# Patient Record
Sex: Female | Born: 1960 | Race: White | Hispanic: No | Marital: Married | Smoking: Never smoker
Health system: Southern US, Community
[De-identification: ages and names within clinical notes are randomized; demographics above are authoritative.]

## PROBLEM LIST (undated history)

## (undated) DIAGNOSIS — J386 Stenosis of larynx: Secondary | ICD-10-CM

## (undated) DIAGNOSIS — J302 Other seasonal allergic rhinitis: Secondary | ICD-10-CM

## (undated) DIAGNOSIS — K219 Gastro-esophageal reflux disease without esophagitis: Secondary | ICD-10-CM

## (undated) DIAGNOSIS — R0600 Dyspnea, unspecified: Secondary | ICD-10-CM

## (undated) DIAGNOSIS — J45909 Unspecified asthma, uncomplicated: Secondary | ICD-10-CM

## (undated) DIAGNOSIS — N342 Other urethritis: Secondary | ICD-10-CM

## (undated) DIAGNOSIS — N6019 Diffuse cystic mastopathy of unspecified breast: Secondary | ICD-10-CM

## (undated) DIAGNOSIS — Z803 Family history of malignant neoplasm of breast: Secondary | ICD-10-CM

## (undated) DIAGNOSIS — B001 Herpesviral vesicular dermatitis: Secondary | ICD-10-CM

## (undated) HISTORY — DX: Family history of malignant neoplasm of breast: Z80.3

## (undated) HISTORY — DX: Diffuse cystic mastopathy of unspecified breast: N60.19

## (undated) HISTORY — DX: Unspecified asthma, uncomplicated: J45.909

## (undated) HISTORY — DX: Other urethritis: N34.2

---
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
## 1992-04-04 HISTORY — PX: TUBAL LIGATION: SHX77

## 2000-03-16 ENCOUNTER — Other Ambulatory Visit: Admission: RE | Admit: 2000-03-16 | Discharge: 2000-03-16 | Payer: Self-pay | Admitting: *Deleted

## 2001-10-23 ENCOUNTER — Other Ambulatory Visit: Admission: RE | Admit: 2001-10-23 | Discharge: 2001-10-23 | Payer: Self-pay | Admitting: Obstetrics and Gynecology

## 2004-09-15 ENCOUNTER — Other Ambulatory Visit: Admission: RE | Admit: 2004-09-15 | Discharge: 2004-09-15 | Payer: Self-pay | Admitting: Obstetrics and Gynecology

## 2004-11-08 ENCOUNTER — Encounter: Admission: RE | Admit: 2004-11-08 | Discharge: 2004-11-08 | Payer: Self-pay | Admitting: *Deleted

## 2006-04-07 ENCOUNTER — Other Ambulatory Visit: Admission: RE | Admit: 2006-04-07 | Discharge: 2006-04-07 | Payer: Self-pay | Admitting: Obstetrics & Gynecology

## 2007-07-24 ENCOUNTER — Other Ambulatory Visit: Admission: RE | Admit: 2007-07-24 | Discharge: 2007-07-24 | Payer: Self-pay | Admitting: Obstetrics & Gynecology

## 2008-05-30 ENCOUNTER — Encounter: Admission: RE | Admit: 2008-05-30 | Discharge: 2008-05-30 | Payer: Self-pay | Admitting: Obstetrics and Gynecology

## 2009-01-15 ENCOUNTER — Encounter: Admission: RE | Admit: 2009-01-15 | Discharge: 2009-01-15 | Payer: Self-pay | Admitting: Obstetrics & Gynecology

## 2010-10-20 ENCOUNTER — Other Ambulatory Visit: Payer: Self-pay | Admitting: Obstetrics and Gynecology

## 2010-10-20 DIAGNOSIS — Z1231 Encounter for screening mammogram for malignant neoplasm of breast: Secondary | ICD-10-CM

## 2010-11-04 ENCOUNTER — Ambulatory Visit: Payer: Self-pay

## 2010-11-09 ENCOUNTER — Ambulatory Visit
Admission: RE | Admit: 2010-11-09 | Discharge: 2010-11-09 | Disposition: A | Discharge status: Home / Self Care | Payer: 59 | Source: Ambulatory Visit | Attending: Obstetrics and Gynecology | Admitting: Obstetrics and Gynecology

## 2010-11-09 ENCOUNTER — Ambulatory Visit: Payer: Self-pay

## 2010-11-09 DIAGNOSIS — Z1231 Encounter for screening mammogram for malignant neoplasm of breast: Secondary | ICD-10-CM

## 2011-12-02 ENCOUNTER — Other Ambulatory Visit: Payer: Self-pay | Admitting: Obstetrics and Gynecology

## 2011-12-02 DIAGNOSIS — Z1231 Encounter for screening mammogram for malignant neoplasm of breast: Secondary | ICD-10-CM

## 2011-12-08 ENCOUNTER — Ambulatory Visit
Admission: RE | Admit: 2011-12-08 | Discharge: 2011-12-08 | Disposition: A | Discharge status: Home / Self Care | Payer: 59 | Source: Ambulatory Visit | Attending: Obstetrics and Gynecology | Admitting: Obstetrics and Gynecology

## 2011-12-08 DIAGNOSIS — Z1231 Encounter for screening mammogram for malignant neoplasm of breast: Secondary | ICD-10-CM

## 2011-12-08 LAB — HM PAP SMEAR: HM Pap smear: NEGATIVE

## 2011-12-12 ENCOUNTER — Other Ambulatory Visit: Payer: Self-pay | Admitting: Obstetrics and Gynecology

## 2011-12-12 DIAGNOSIS — R928 Other abnormal and inconclusive findings on diagnostic imaging of breast: Secondary | ICD-10-CM

## 2011-12-19 ENCOUNTER — Ambulatory Visit
Admission: RE | Admit: 2011-12-19 | Discharge: 2011-12-19 | Disposition: A | Discharge status: Home / Self Care | Payer: 59 | Source: Ambulatory Visit | Attending: Obstetrics and Gynecology | Admitting: Obstetrics and Gynecology

## 2011-12-19 DIAGNOSIS — R928 Other abnormal and inconclusive findings on diagnostic imaging of breast: Secondary | ICD-10-CM

## 2012-12-10 ENCOUNTER — Encounter: Payer: Self-pay | Admitting: Nurse Practitioner

## 2012-12-11 ENCOUNTER — Encounter: Payer: Self-pay | Admitting: Nurse Practitioner

## 2012-12-11 ENCOUNTER — Ambulatory Visit (INDEPENDENT_AMBULATORY_CARE_PROVIDER_SITE_OTHER): Payer: 59 | Admitting: Nurse Practitioner

## 2012-12-11 VITALS — BP 108/60 | HR 64 | Resp 16 | Ht 67.5 in | Wt 160.0 lb

## 2012-12-11 DIAGNOSIS — Z0000 Encounter for general adult medical examination without abnormal findings: Secondary | ICD-10-CM

## 2012-12-11 DIAGNOSIS — N912 Amenorrhea, unspecified: Secondary | ICD-10-CM

## 2012-12-11 DIAGNOSIS — Z01419 Encounter for gynecological examination (general) (routine) without abnormal findings: Secondary | ICD-10-CM

## 2012-12-11 LAB — COMPREHENSIVE METABOLIC PANEL
ALT: 17 U/L (ref 0–35)
Albumin: 4.8 g/dL (ref 3.5–5.2)
Alkaline Phosphatase: 38 U/L — ABNORMAL LOW (ref 39–117)
BUN: 16 mg/dL (ref 6–23)
Calcium: 10.3 mg/dL (ref 8.4–10.5)
Total Bilirubin: 0.8 mg/dL (ref 0.3–1.2)

## 2012-12-11 LAB — POCT URINALYSIS DIPSTICK
Nitrite, UA: NEGATIVE
Protein, UA: NEGATIVE
Urobilinogen, UA: NEGATIVE
pH, UA: 5

## 2012-12-11 LAB — LIPID PANEL
HDL: 51 mg/dL (ref >39)
Total CHOL/HDL Ratio: 3.1 Ratio
Triglycerides: 104 mg/dL (ref <150)

## 2012-12-11 LAB — TSH: TSH: 2.069 u[IU]/mL (ref 0.350–4.500)

## 2012-12-11 LAB — HEMOGLOBIN, FINGERSTICK: Hemoglobin, fingerstick: 14.2 g/dL (ref 12.0–16.0)

## 2012-12-11 MED ORDER — MEDROXYPROGESTERONE ACETATE 10 MG PO TABS: 10.0000 mg | ORAL_TABLET | Freq: Every day | ORAL | Status: DC

## 2012-12-11 NOTE — Progress Notes (Signed)
Patient ID: Angela Duarte, female   DOB: [birth year], 52 y.o.   MRN: [id number] 52 y.o. [id number] Married Caucasian Fe here for annual exam.  She feels well.  Having a lot more vaso symptoms especially at night. Does not ever want HRT. No menses since February. Last year irregular cycles and took Provera last May with regular cycles until this year.  No signs of PMS or menstrual symptoms.  Patient's last menstrual period was 05/05/2012.          Sexually active: yes  The current method of family planning is tubal ligation.    Exercising: no  The patient does not participate in regular exercise at present. Smoker:  no  Health Maintenance: Pap:  12/08/11, WNL, neg HR HPV MMG:  12/08/11, U/S 9/04/10/11 BI-Rads 2: benign findings Colonoscopy:  08/2012, repeat in 10 years BMD:   never TDaP:  Fall 2009 Labs: HB:   Urine: Negative, pH 5.0    reports that she has never smoked. She has never used smokeless tobacco. She reports that she does not drink alcohol or use illicit drugs.  Past Medical History  Diagnosis Date  . Urethritis     post coital  . Fibrocystic breast changes     Past Surgical History  Procedure Laterality Date  . Ureter surgery  1968    reconstruction  . Appendectomy  1976  . Tubal ligation Bilateral 1984    Current Outpatient Prescriptions  Medication Sig Dispense Refill  . cetirizine (ZYRTEC) 10 MG tablet Take 10 mg by mouth daily.       No current facility-administered medications for this visit.    Family History  Problem Relation Age of Onset  . Breast cancer Sister 3  . Breast cancer Maternal Aunt     post menopausal  . Breast cancer Maternal Grandmother     post menopausal    ROS:  Pertinent items are noted in HPI.  Otherwise, a comprehensive ROS was negative.  Exam:   BP 108/60  Pulse 64  Resp 16  Ht 5' 7.5" (1.715 m)  Wt 160 lb (72.576 kg)  BMI 24.68 kg/m2  LMP 05/05/2012 Height: 5' 7.5" (171.5 cm)  Ht Readings from Last 3 Encounters:  12/11/12  5' 7.5" (1.715 m)    General appearance: alert, cooperative and appears stated age Head: Normocephalic, without obvious abnormality, atraumatic Neck: no adenopathy, supple, symmetrical, trachea midline and thyroid normal to inspection and palpation Lungs: clear to auscultation bilaterally Breasts: normal appearance, no masses or tenderness, FCB changes bilaterally wothout mass. Heart: regular rate and rhythm Abdomen: soft, non-tender; no masses,  no organomegaly Extremities: extremities normal, atraumatic, no cyanosis or edema Skin: Skin color, texture, turgor normal. No rashes or lesions Lymph nodes: Cervical, supraclavicular, and axillary nodes normal. No abnormal inguinal nodes palpated Neurologic: Grossly normal   Pelvic: External genitalia:  no lesions              Urethra:  normal appearing urethra with no masses, tenderness or lesions              Bartholin's and Skene's: normal                 Vagina: normal appearing vagina with normal color and discharge, no lesions              Cervix: anteverted              Pap taken: no Bimanual Exam:  Uterus:  normal size, contour, position, consistency,  mobility, non-tender              Adnexa: no mass, fullness, tenderness               Rectovaginal: Confirms               Anus:  normal sphincter tone, no lesions  A:  Well Woman with normal exam  Peri menopausal consistent with irregular cycles  Amenorrhea since 05/05/2012  History of FCB  P:   Pap smear as per guidelines   Mammogram due 9/14  Provera challenge 10 mg for 10 days, then expect withdrawal bleed, if no bleeding to call back in 3-4 weeks.  Will get routine labs including an [facility] today and follow  Counseled on breast self exam, adequate intake of calcium and vitamin D, diet and exercise return annually or prn  An After Visit Summary was printed and given to the patient.

## 2012-12-11 NOTE — Patient Instructions (Addendum)

## 2012-12-12 LAB — FOLLICLE STIMULATING HORMONE: FSH: 118.5 m[IU]/mL — ABNORMAL HIGH

## 2012-12-13 NOTE — Progress Notes (Signed)
Encounter reviewed by Dr. Dajanee Voorheis Silva.  

## 2013-02-07 ENCOUNTER — Other Ambulatory Visit: Payer: Self-pay

## 2013-03-14 ENCOUNTER — Other Ambulatory Visit: Payer: Self-pay

## 2013-03-14 DIAGNOSIS — Z1231 Encounter for screening mammogram for malignant neoplasm of breast: Secondary | ICD-10-CM

## 2013-03-27 ENCOUNTER — Encounter: Payer: Self-pay | Admitting: Nurse Practitioner

## 2013-04-25 ENCOUNTER — Ambulatory Visit
Admission: RE | Admit: 2013-04-25 | Discharge: 2013-04-25 | Disposition: A | Discharge status: Home / Self Care | Payer: BC Managed Care – PPO | Source: Ambulatory Visit

## 2013-04-25 DIAGNOSIS — Z1231 Encounter for screening mammogram for malignant neoplasm of breast: Secondary | ICD-10-CM

## 2013-04-26 ENCOUNTER — Other Ambulatory Visit: Payer: Self-pay | Admitting: Obstetrics and Gynecology

## 2013-04-26 ENCOUNTER — Other Ambulatory Visit: Payer: Self-pay | Admitting: Cardiology

## 2013-04-26 DIAGNOSIS — R928 Other abnormal and inconclusive findings on diagnostic imaging of breast: Secondary | ICD-10-CM

## 2013-05-03 ENCOUNTER — Other Ambulatory Visit: Payer: Self-pay | Admitting: Specialist

## 2013-05-03 DIAGNOSIS — R109 Unspecified abdominal pain: Secondary | ICD-10-CM

## 2013-05-07 ENCOUNTER — Ambulatory Visit
Admission: RE | Admit: 2013-05-07 | Discharge: 2013-05-07 | Disposition: A | Discharge status: Home / Self Care | Payer: BC Managed Care – PPO | Source: Ambulatory Visit | Attending: Obstetrics and Gynecology | Admitting: Obstetrics and Gynecology

## 2013-05-07 DIAGNOSIS — R928 Other abnormal and inconclusive findings on diagnostic imaging of breast: Secondary | ICD-10-CM

## 2013-05-10 ENCOUNTER — Ambulatory Visit
Admission: RE | Admit: 2013-05-10 | Discharge: 2013-05-10 | Disposition: A | Discharge status: Home / Self Care | Payer: BC Managed Care – PPO | Source: Ambulatory Visit | Attending: Specialist | Admitting: Specialist

## 2013-05-10 ENCOUNTER — Other Ambulatory Visit: Payer: Self-pay | Admitting: Specialist

## 2013-05-10 DIAGNOSIS — R109 Unspecified abdominal pain: Secondary | ICD-10-CM

## 2013-05-10 MED ORDER — IOHEXOL 300 MG/ML  SOLN: 100.0000 mL | Freq: Once | INTRAMUSCULAR | Status: DC | PRN

## 2013-05-10 MED ORDER — IOHEXOL 300 MG/ML  SOLN
100.0000 mL | Freq: Once | INTRAMUSCULAR | Status: AC | PRN
  Administered 2013-05-10: 100 mL via INTRAVENOUS

## 2013-05-24 ENCOUNTER — Telehealth: Payer: Self-pay | Admitting: Nurse Practitioner

## 2013-05-24 ENCOUNTER — Other Ambulatory Visit: Payer: Self-pay | Admitting: Nurse Practitioner

## 2013-05-24 MED ORDER — SULFAMETHOXAZOLE-TMP DS 800-160 MG PO TABS: 1.0000 | ORAL_TABLET | Freq: Two times a day (BID) | ORAL | Status: DC

## 2013-05-24 MED ORDER — SULFAMETHOXAZOLE-TMP DS 800-160 MG PO TABS: ORAL_TABLET | ORAL | Status: DC

## 2013-05-24 NOTE — Telephone Encounter (Signed)
Patient calling to request sufamethoxazole RX. She says she normally gets this from [place] year.  [facility] [address] [address] [place]  [phone]

## 2013-05-24 NOTE — Telephone Encounter (Signed)
Last AEX 12/11/12 Next appt 12/23/2013  Last refill 06/06/2011 #30/3 refills. Paper chart in your door.

## 2013-05-24 NOTE — Telephone Encounter (Signed)
Rx approved by Ms. Patty LM for pt notifying we sent Rx to her pharmacy.

## 2013-12-12 ENCOUNTER — Ambulatory Visit: Payer: 59 | Admitting: Nurse Practitioner

## 2013-12-23 ENCOUNTER — Ambulatory Visit: Payer: 59 | Admitting: Nurse Practitioner

## 2014-01-28 ENCOUNTER — Encounter: Payer: Self-pay | Admitting: Nurse Practitioner

## 2014-01-28 ENCOUNTER — Ambulatory Visit (INDEPENDENT_AMBULATORY_CARE_PROVIDER_SITE_OTHER): Payer: BC Managed Care – PPO | Admitting: Nurse Practitioner

## 2014-01-28 ENCOUNTER — Other Ambulatory Visit: Payer: Self-pay | Admitting: Nurse Practitioner

## 2014-01-28 VITALS — BP 130/84 | HR 68 | Ht 67.75 in | Wt 160.0 lb

## 2014-01-28 DIAGNOSIS — Z0000 Encounter for general adult medical examination without abnormal findings: Secondary | ICD-10-CM

## 2014-01-28 DIAGNOSIS — Z01419 Encounter for gynecological examination (general) (routine) without abnormal findings: Secondary | ICD-10-CM

## 2014-01-28 DIAGNOSIS — R8290 Unspecified abnormal findings in urine: Secondary | ICD-10-CM

## 2014-01-28 LAB — LIPID PANEL
Cholesterol: 157 mg/dL (ref 0–200)
HDL: 55 mg/dL (ref >39)
LDL CALC: 85 mg/dL (ref 0–99)
TRIGLYCERIDES: 87 mg/dL (ref <150)
Total CHOL/HDL Ratio: 2.9 Ratio
VLDL: 17 mg/dL (ref 0–40)

## 2014-01-28 LAB — COMPREHENSIVE METABOLIC PANEL
ALK PHOS: 38 U/L — AB (ref 39–117)
ALT: 20 U/L (ref 0–35)
AST: 21 U/L (ref 0–37)
Albumin: 4.6 g/dL (ref 3.5–5.2)
BUN: 16 mg/dL (ref 6–23)
CALCIUM: 9.7 mg/dL (ref 8.4–10.5)
CHLORIDE: 105 meq/L (ref 96–112)
CO2: 28 mEq/L (ref 19–32)
Creat: 0.77 mg/dL (ref 0.50–1.10)
Glucose, Bld: 100 mg/dL — ABNORMAL HIGH (ref 70–99)
Potassium: 4.8 mEq/L (ref 3.5–5.3)
SODIUM: 139 meq/L (ref 135–145)
TOTAL PROTEIN: 7 g/dL (ref 6.0–8.3)
Total Bilirubin: 0.7 mg/dL (ref 0.2–1.2)

## 2014-01-28 LAB — POCT URINALYSIS DIPSTICK
BILIRUBIN UA: NEGATIVE
Glucose, UA: NEGATIVE
KETONES UA: NEGATIVE
NITRITE UA: NEGATIVE
PH UA: 5
Protein, UA: NEGATIVE
RBC UA: NEGATIVE
Urobilinogen, UA: NEGATIVE

## 2014-01-28 LAB — HEMOGLOBIN, FINGERSTICK: Hemoglobin, fingerstick: 13.9 g/dL (ref 12.0–16.0)

## 2014-01-28 LAB — TSH: TSH: 1.662 u[IU]/mL (ref 0.350–4.500)

## 2014-01-28 MED ORDER — SULFAMETHOXAZOLE-TMP DS 800-160 MG PO TABS: ORAL_TABLET | ORAL | Status: DC

## 2014-01-28 NOTE — Progress Notes (Addendum)
Patient ID: Angela DesanctisCynthia F Janeway, female   DOB: 12/30/1960, 53 y.o.   MRN: [id number] 53 y.o. [id number] Married Caucasian Fe here for annual exam.  This year had menses in January or February after using pregesterone cream for a month. This occurred about 30 -45 days later with a light cycle that lasted 3-4 days   No menses since then.  Some vaso symptoms mainly at night but they are tolerable.   Sleep is OK.  Rare use of Bactrim PC.  Patient's last menstrual period was 05/05/2013.          Sexually active: yes  The current method of family planning is tubal ligation.  Exercising: no The patient does not participate in regular exercise at present.  Smoker: no   Health Maintenance:  Pap: 12/08/11, WNL, neg HR HPV  MMG: 04/25/13,Diagnostic left 05/08/03, BI-Rads 2: benign findings, repeat in one year Colonoscopy: 08/2012, repeat in 10 years  TDaP: Fall 2009  Labs:  HB:  13.9  Urine:  Trace leuk's (no symptoms)   reports that she has never smoked. She has never used smokeless tobacco. She reports that she does not drink alcohol or use illicit drugs.  Past Medical History  Diagnosis Date  . Urethritis     post coital  . Fibrocystic breast changes     Past Surgical History  Procedure Laterality Date  . Ureter surgery  1968    reconstruction  . Appendectomy  1976  . Tubal ligation Bilateral 1994  . Wisdom tooth extraction  1981    Current Outpatient Prescriptions  Medication Sig Dispense Refill  . cetirizine (ZYRTEC) 10 MG tablet Take 10 mg by mouth daily.      Marland Kitchen. sulfamethoxazole-trimethoprim (BACTRIM DS) 800-160 MG per tablet 1 tab PC prn  30 tablet  1   No current facility-administered medications for this visit.    Family History  Problem Relation Age of Onset  . Breast cancer Sister [id number]    first occurence age 52, second at age 52  . Breast cancer Maternal Aunt     post menopausal  . Pancreatic cancer Mother [phone]  . Breast cancer Paternal Aunt     post menoausal    ROS:  Pertinent  items are noted in HPI.  Otherwise, a comprehensive ROS was negative.  Exam:   BP 130/84  Pulse 68  Ht 5' 7.75" (1.721 m)  Wt 160 lb (72.576 kg)  BMI 24.50 kg/m2  LMP 05/05/2013 Height: 5' 7.75" (172.1 cm)  Ht Readings from Last 3 Encounters:  01/28/14 5' 7.75" (1.721 m)  12/11/12 5' 7.5" (1.715 m)    General appearance: alert, cooperative and appears stated age Head: Normocephalic, without obvious abnormality, atraumatic Neck: no adenopathy, supple, symmetrical, trachea midline and thyroid normal to inspection and palpation Lungs: clear to auscultation bilaterally Breasts: normal appearance, no masses or tenderness Heart: regular rate and rhythm Abdomen: soft, non-tender; no masses,  no organomegaly Extremities: extremities normal, atraumatic, no cyanosis or edema Skin: Skin color, texture, turgor normal. No rashes or lesions Lymph nodes: Cervical, supraclavicular, and axillary nodes normal. No abnormal inguinal nodes palpated Neurologic: Grossly normal   Pelvic: External genitalia:  no lesions              Urethra:  normal appearing urethra with no masses, tenderness or lesions              Bartholin's and Skene's: normal  Vagina: normal appearing vagina with normal color and discharge, no lesions              Cervix: anteverted              Pap taken: No. Bimanual Exam:  Uterus:  normal size, contour, position, consistency, mobility, non-tender              Adnexa: no mass, fullness, tenderness               Rectovaginal: Confirms               Anus:  normal sphincter tone, no lesions  A:  Well Woman with normal exam  Peri menopausal consistent with irregular cycles   Amenorrhea since 05/2013  History of FCB  FMH of breast cancer  R/O UTI  P:   Reviewed health and wellness pertinent to exam  Pap smear not taken today  Mammogram is due 1/16  Will folw with labs and urine  Counseled on breast self exam, mammography screening, adequate intake of calcium  and vitamin D, diet and exercise, Kegel's exercises return annually or prn  An After Visit Summary was printed and given to the patient.

## 2014-01-28 NOTE — Patient Instructions (Signed)

## 2014-01-29 LAB — VITAMIN D 25 HYDROXY (VIT D DEFICIENCY, FRACTURES): Vit D, 25-Hydroxy: 41 ng/mL (ref 30–89)

## 2014-01-30 LAB — HEMOGLOBIN A1C
Hgb A1c MFr Bld: 5.3 % (ref <5.7)
Mean Plasma Glucose: 105 mg/dL (ref <117)

## 2014-01-30 LAB — URINE CULTURE
COLONY COUNT: NO GROWTH
Organism ID, Bacteria: NO GROWTH

## 2014-01-30 NOTE — Progress Notes (Signed)
Encounter reviewed by Dr. Jonae Renshaw Silva.  

## 2014-02-03 ENCOUNTER — Encounter: Payer: Self-pay | Admitting: Nurse Practitioner

## 2014-03-13 ENCOUNTER — Telehealth: Payer: Self-pay | Admitting: Nurse Practitioner

## 2014-03-13 NOTE — Telephone Encounter (Signed)
Pt had aex in October and was under the impression her urine was cultured and hasn't received results.  Please call pt  bf

## 2014-03-13 NOTE — Telephone Encounter (Signed)
Spoke with patient. Advised patient of results as seen below from Lauro FranklinPatricia Rolen-Grubb, FNP. Patient is agreeable and verbalizes understanding.  Notes Recorded by Lauro FranklinPatricia Rolen-Grubb, FNP on 01/30/2014 at 3:04 PM Results via my chart:  Aram Beechamynthia, The urine culture was negative. Lipid panel looks great continue with the healthy diet. Thyroid, Vit D, liver function, kidney function is al normal. The glucose was elevated so added a HGB AIC ( 3 month average of Blood sugar).and it was normal. See the next test result for that. Notes Recorded by Lauro FranklinPatricia Rolen-Grubb, FNP on 01/30/2014 at 8:10 AM Add HGB AIC  Routing to provider for final review. Patient agreeable to disposition. Will close encounter

## 2014-07-17 ENCOUNTER — Telehealth: Payer: Self-pay | Admitting: Nurse Practitioner

## 2014-07-17 NOTE — Telephone Encounter (Signed)
Left patient a message to call back to reschedule a future appointment that was cancelled by the provider. °

## 2014-12-31 ENCOUNTER — Other Ambulatory Visit: Payer: Self-pay

## 2014-12-31 DIAGNOSIS — Z1231 Encounter for screening mammogram for malignant neoplasm of breast: Secondary | ICD-10-CM

## 2015-01-28 ENCOUNTER — Ambulatory Visit: Payer: No Typology Code available for payment source

## 2015-02-04 ENCOUNTER — Encounter: Payer: Self-pay | Admitting: Nurse Practitioner

## 2015-02-04 ENCOUNTER — Ambulatory Visit (INDEPENDENT_AMBULATORY_CARE_PROVIDER_SITE_OTHER): Payer: PRIVATE HEALTH INSURANCE | Admitting: Nurse Practitioner

## 2015-02-04 VITALS — BP 110/82 | HR 68 | Ht 67.5 in | Wt 168.0 lb

## 2015-02-04 DIAGNOSIS — Z01419 Encounter for gynecological examination (general) (routine) without abnormal findings: Secondary | ICD-10-CM | POA: Diagnosis not present

## 2015-02-04 DIAGNOSIS — Z0000 Encounter for general adult medical examination without abnormal findings: Secondary | ICD-10-CM

## 2015-02-04 DIAGNOSIS — Z800 Family history of malignant neoplasm of digestive organs: Secondary | ICD-10-CM | POA: Diagnosis not present

## 2015-02-04 LAB — POCT URINALYSIS DIPSTICK
Bilirubin, UA: NEGATIVE
Blood, UA: NEGATIVE
GLUCOSE UA: NEGATIVE
Ketones, UA: NEGATIVE
Nitrite, UA: NEGATIVE
PROTEIN UA: NEGATIVE
Urobilinogen, UA: NEGATIVE
pH, UA: 6

## 2015-02-04 LAB — LIPID PANEL
Cholesterol: 180 mg/dL (ref 125–200)
HDL: 51 mg/dL (ref >=46)
LDL CALC: 109 mg/dL (ref <130)
TRIGLYCERIDES: 100 mg/dL (ref <150)
Total CHOL/HDL Ratio: 3.5 Ratio (ref <=5.0)
VLDL: 20 mg/dL (ref <30)

## 2015-02-04 LAB — TSH: TSH: 1.642 u[IU]/mL (ref 0.350–4.500)

## 2015-02-04 LAB — COMPREHENSIVE METABOLIC PANEL
ALBUMIN: 4.6 g/dL (ref 3.6–5.1)
ALT: 29 U/L (ref 6–29)
AST: 26 U/L (ref 10–35)
Alkaline Phosphatase: 56 U/L (ref 33–130)
BUN: 15 mg/dL (ref 7–25)
CHLORIDE: 104 mmol/L (ref 98–110)
CO2: 24 mmol/L (ref 20–31)
CREATININE: 0.72 mg/dL (ref 0.50–1.05)
Calcium: 9.5 mg/dL (ref 8.6–10.4)
GLUCOSE: 88 mg/dL (ref 65–99)
POTASSIUM: 4.5 mmol/L (ref 3.5–5.3)
SODIUM: 138 mmol/L (ref 135–146)
Total Bilirubin: 0.7 mg/dL (ref 0.2–1.2)
Total Protein: 7 g/dL (ref 6.1–8.1)

## 2015-02-04 LAB — HEMOGLOBIN, FINGERSTICK: Hemoglobin, fingerstick: 14.1 g/dL (ref 12.0–16.0)

## 2015-02-04 NOTE — Patient Instructions (Signed)

## 2015-02-04 NOTE — Progress Notes (Signed)
Patient ID: Angela Duarte, female   DOB: 11/25/1960, 54 y.o.   MRN: [id number] 54 y.o. [id number] Married  Caucasian Fe here for annual exam.  Amenorrhea since  05/2013.  Some vaso symptoms that are tolerable. Some vaginal dryness.  She had CT scan done 05/10/13 which was normal for screening of pancreatic cancer.  Her antigen test was negative in 2014 ?Marland Kitchen.  She desires a repeat antigen test.  Patient's last menstrual period was 05/05/2013 (approximate).          Sexually active: Yes.    The current method of family planning is tubal ligation and post menopausal status.    Exercising: No.  The patient does not participate in regular exercise at present. Smoker:  no  Health Maintenance: Pap: 12/08/11, WNL, neg HR HPV  MMG: 04/25/13,Diagnostic left 05/07/13, BI-Rads 2: benign findings, repeat scheduled for 02/24/15 Colonoscopy: 08/2012, normal, repeat in 10 years  TDaP: Fall 2009  Labs: HB: 14.1   Urine: Trace leuk's no symptoms   reports that she has never smoked. She has never used smokeless tobacco. She reports that she does not drink alcohol or use illicit drugs.  Past Medical History  Diagnosis Date  . Urethritis     post coital  . Fibrocystic breast changes     Past Surgical History  Procedure Laterality Date  . Ureter surgery  1968    reconstruction  . Appendectomy  1976  . Tubal ligation Bilateral 1994  . Wisdom tooth extraction  1981    Current Outpatient Prescriptions  Medication Sig Dispense Refill  . cetirizine (ZYRTEC) 10 MG tablet Take 10 mg by mouth daily.    Marland Kitchen. sulfamethoxazole-trimethoprim (BACTRIM DS) 800-160 MG per tablet 1 tab PC prn 30 tablet 1  . valACYclovir (VALTREX) 500 MG tablet as directed.  1   No current facility-administered medications for this visit.    Family History  Problem Relation Age of Onset  . Breast cancer Sister [id number]    first occurence age 53, second at age 53  . Breast cancer Maternal Aunt     post menopausal  . Pancreatic cancer Mother  [id number]  . Breast cancer Paternal Aunt     post menoausal    ROS:  Pertinent items are noted in HPI.  Otherwise, a comprehensive ROS was negative.  Exam:   BP 110/82 mmHg  Pulse 68  Ht 5' 7.5" (1.715 m)  Wt 168 lb (76.204 kg)  BMI 25.91 kg/m2  LMP 05/05/2013 (Approximate) Height: 5' 7.5" (171.5 cm) Ht Readings from Last 3 Encounters:  02/04/15 5' 7.5" (1.715 m)  01/28/14 5' 7.75" (1.721 m)  12/11/12 5' 7.5" (1.715 m)    General appearance: alert, cooperative and appears stated age Head: Normocephalic, without obvious abnormality, atraumatic Neck: no adenopathy, supple, symmetrical, trachea midline and thyroid normal to inspection and palpation Lungs: clear to auscultation bilaterally Breasts: normal appearance, no masses or tenderness Heart: regular rate and rhythm Abdomen: soft, non-tender; no masses,  no organomegaly Extremities: extremities normal, atraumatic, no cyanosis or edema Skin: Skin color, texture, turgor normal. No rashes or lesions Lymph nodes: Cervical, supraclavicular, and axillary nodes normal. No abnormal inguinal nodes palpated Neurologic: Grossly normal   Pelvic: External genitalia:  no lesions              Urethra:  normal appearing urethra with no masses, tenderness or lesions              Bartholin's and Skene's: normal  Vagina: normal appearing vagina with normal color and discharge, no lesions              Cervix: anteverted              Pap taken: Yes.   Bimanual Exam:  Uterus:  normal size, contour, position, consistency, mobility, non-tender              Adnexa: no mass, fullness, tenderness               Rectovaginal: Confirms               Anus:  normal sphincter tone, no lesions  Chaperone present: yes  A:  Well Woman with normal exam  Peri menopausal consistent with irregular cycles  Amenorrhea since 05/2013 History of FCB [facility] of breast cancer and pancreatic cancer   P:    Reviewed health and wellness pertinent to exam  Pap smear as above  Mammogram is due and is scheduled for 02/24/15  Will get screening test for pancreatic cancer antigen, and follow with other labs.  She does not need a refill on Bactrim or Valtrex for oral HSV at this time - will call back.  Counseled on breast self exam, mammography screening, adequate intake of calcium and vitamin D, diet and exercise, Kegel's exercises return annually or prn  An After Visit Summary was printed and given to the patient.

## 2015-02-05 ENCOUNTER — Ambulatory Visit: Payer: BC Managed Care – PPO | Admitting: Nurse Practitioner

## 2015-02-05 LAB — HIV ANTIBODY (ROUTINE TESTING W REFLEX): HIV: NONREACTIVE

## 2015-02-05 LAB — VITAMIN D 25 HYDROXY (VIT D DEFICIENCY, FRACTURES): Vit D, 25-Hydroxy: 34 ng/mL (ref 30–100)

## 2015-02-05 LAB — CANCER ANTIGEN 19-9: CA 19-9: 26.8 U/mL (ref <35.0)

## 2015-02-05 LAB — HEPATITIS C ANTIBODY: HCV Ab: NEGATIVE

## 2015-02-05 NOTE — Progress Notes (Signed)
Encounter reviewed by Dr. Krystle Oberman Amundson C. Silva.  

## 2015-02-09 LAB — IPS PAP TEST WITH HPV

## 2015-02-24 ENCOUNTER — Ambulatory Visit
Admission: RE | Admit: 2015-02-24 | Discharge: 2015-02-24 | Disposition: A | Discharge status: Home / Self Care | Payer: 59 | Source: Ambulatory Visit

## 2015-02-24 DIAGNOSIS — Z1231 Encounter for screening mammogram for malignant neoplasm of breast: Secondary | ICD-10-CM

## 2015-11-23 DIAGNOSIS — J301 Allergic rhinitis due to pollen: Secondary | ICD-10-CM | POA: Insufficient documentation

## 2015-11-23 DIAGNOSIS — M47814 Spondylosis without myelopathy or radiculopathy, thoracic region: Secondary | ICD-10-CM | POA: Insufficient documentation

## 2015-11-23 DIAGNOSIS — K602 Anal fissure, unspecified: Secondary | ICD-10-CM | POA: Insufficient documentation

## 2015-12-03 DIAGNOSIS — R350 Frequency of micturition: Secondary | ICD-10-CM | POA: Insufficient documentation

## 2015-12-03 DIAGNOSIS — R922 Inconclusive mammogram: Secondary | ICD-10-CM | POA: Insufficient documentation

## 2015-12-03 DIAGNOSIS — Z9189 Other specified personal risk factors, not elsewhere classified: Secondary | ICD-10-CM | POA: Insufficient documentation

## 2015-12-03 DIAGNOSIS — Z800 Family history of malignant neoplasm of digestive organs: Secondary | ICD-10-CM | POA: Insufficient documentation

## 2015-12-22 ENCOUNTER — Other Ambulatory Visit: Payer: Self-pay | Admitting: Internal Medicine

## 2015-12-22 ENCOUNTER — Other Ambulatory Visit: Payer: Self-pay | Admitting: Specialist

## 2015-12-22 DIAGNOSIS — R1030 Lower abdominal pain, unspecified: Secondary | ICD-10-CM

## 2015-12-25 ENCOUNTER — Ambulatory Visit
Admission: RE | Admit: 2015-12-25 | Discharge: 2015-12-25 | Disposition: A | Discharge status: Home / Self Care | Payer: No Typology Code available for payment source | Source: Ambulatory Visit | Attending: Specialist | Admitting: Specialist

## 2015-12-25 ENCOUNTER — Other Ambulatory Visit: Payer: Self-pay | Admitting: Specialist

## 2015-12-25 DIAGNOSIS — R1030 Lower abdominal pain, unspecified: Secondary | ICD-10-CM

## 2015-12-28 ENCOUNTER — Other Ambulatory Visit: Payer: 59

## 2016-02-16 ENCOUNTER — Other Ambulatory Visit: Payer: Self-pay | Admitting: Nurse Practitioner

## 2016-02-16 DIAGNOSIS — Z1231 Encounter for screening mammogram for malignant neoplasm of breast: Secondary | ICD-10-CM

## 2016-03-01 ENCOUNTER — Encounter: Payer: Self-pay | Admitting: Nurse Practitioner

## 2016-03-01 ENCOUNTER — Other Ambulatory Visit: Payer: Self-pay | Admitting: Nurse Practitioner

## 2016-03-01 ENCOUNTER — Ambulatory Visit (INDEPENDENT_AMBULATORY_CARE_PROVIDER_SITE_OTHER): Payer: PRIVATE HEALTH INSURANCE | Admitting: Nurse Practitioner

## 2016-03-01 VITALS — BP 116/74 | HR 72 | Ht 67.25 in | Wt 162.0 lb

## 2016-03-01 DIAGNOSIS — Z01419 Encounter for gynecological examination (general) (routine) without abnormal findings: Secondary | ICD-10-CM | POA: Diagnosis not present

## 2016-03-01 DIAGNOSIS — Z800 Family history of malignant neoplasm of digestive organs: Secondary | ICD-10-CM | POA: Diagnosis not present

## 2016-03-01 DIAGNOSIS — Z0000 Encounter for general adult medical examination without abnormal findings: Secondary | ICD-10-CM

## 2016-03-01 DIAGNOSIS — N760 Acute vaginitis: Secondary | ICD-10-CM

## 2016-03-01 LAB — CBC
HEMATOCRIT: 44.1 % (ref 35.0–45.0)
Hemoglobin: 15.2 g/dL (ref 11.7–15.5)
MCH: 32.8 pg (ref 27.0–33.0)
MCHC: 34.5 g/dL (ref 32.0–36.0)
MCV: 95 fL (ref 80.0–100.0)
MPV: 9.4 fL (ref 7.5–12.5)
Platelets: 197 10*3/uL (ref 140–400)
RBC: 4.64 MIL/uL (ref 3.80–5.10)
RDW: 12.9 % (ref 11.0–15.0)
WBC: 4.4 10*3/uL (ref 3.8–10.8)

## 2016-03-01 LAB — COMPREHENSIVE METABOLIC PANEL
ALBUMIN: 4.5 g/dL (ref 3.6–5.1)
ALT: 21 U/L (ref 6–29)
AST: 21 U/L (ref 10–35)
Alkaline Phosphatase: 53 U/L (ref 33–130)
BUN: 12 mg/dL (ref 7–25)
CALCIUM: 9.6 mg/dL (ref 8.6–10.4)
CHLORIDE: 106 mmol/L (ref 98–110)
CO2: 23 mmol/L (ref 20–31)
CREATININE: 0.86 mg/dL (ref 0.50–1.05)
Glucose, Bld: 90 mg/dL (ref 65–99)
Potassium: 4.3 mmol/L (ref 3.5–5.3)
SODIUM: 141 mmol/L (ref 135–146)
Total Bilirubin: 0.8 mg/dL (ref 0.2–1.2)
Total Protein: 6.8 g/dL (ref 6.1–8.1)

## 2016-03-01 LAB — LIPID PANEL
Cholesterol: 176 mg/dL (ref <200)
HDL: 65 mg/dL (ref >50)
LDL CALC: 98 mg/dL (ref <100)
TRIGLYCERIDES: 64 mg/dL (ref <150)
Total CHOL/HDL Ratio: 2.7 Ratio (ref <5.0)
VLDL: 13 mg/dL (ref <30)

## 2016-03-01 LAB — POCT URINALYSIS DIPSTICK
BILIRUBIN UA: NEGATIVE
GLUCOSE UA: NEGATIVE
KETONES UA: NEGATIVE
Leukocytes, UA: NEGATIVE
NITRITE UA: NEGATIVE
Protein, UA: NEGATIVE
RBC UA: NEGATIVE
Urobilinogen, UA: NEGATIVE
pH, UA: 5

## 2016-03-01 LAB — TSH: TSH: 1.64 mIU/L

## 2016-03-01 LAB — HEMOGLOBIN, FINGERSTICK: Hemoglobin, fingerstick: 14.7 g/dL (ref 12.0–16.0)

## 2016-03-01 NOTE — Progress Notes (Signed)
Patient ID: Angela DesanctisCynthia F Duarte, female   DOB: 09/24/1960, 55 y.o.   MRN: [id number]  55 y.o. [id number] Married  Caucasian Fe here for annual exam.  [place].  Some vaso symptoms that are tolerable. She had been taking Vit K2 for 2 weeks.  She then stopped and had right OV pain like ovulation and spotted with wiping only.  Then PUS was done on 12/25/15 with endo lining at 8 mm.  She was told no other follow up was needed.  Last FSH was 118.5 on 12/11/2012.    Sister had genetic testing done for pancreatic cancer and her test was positive.    Patient's last menstrual period was 05/05/2013 (approximate).          Sexually active: Yes.    The current method of family planning is tubal ligation and post menopausal status.    Exercising: No.  The patient does not participate in regular exercise at present. Smoker:  no  Health Maintenance: Pap: 02/04/15, Negative with neg HR HPV  MMG: 02/24/15, 3D, Bi-Rads 2: Benign Findings, scheduled for 03/22/16 Colonoscopy: 08/2012, normal, repeat in 10 years BMD: Never TDaP: 12/04/07 Hep C and HIV: 02/04/15 Labs: HB: 14.7   Urine: Negative   reports that she has never smoked. She has never used smokeless tobacco. She reports that she does not drink alcohol or use drugs.  Past Medical History:  Diagnosis Date  . Fibrocystic breast changes   . Urethritis    post coital    Past Surgical History:  Procedure Laterality Date  . APPENDECTOMY  1976  . TUBAL LIGATION Bilateral 1994  . URETER SURGERY  1968   reconstruction  . WISDOM TOOTH EXTRACTION  1981    Current Outpatient Prescriptions  Medication Sig Dispense Refill  . cetirizine (ZYRTEC) 10 MG tablet Take 10 mg by mouth daily.    Marland Kitchen. sulfamethoxazole-trimethoprim (BACTRIM DS) 800-160 MG per tablet 1 tab PC prn 30 tablet 1  . valACYclovir (VALTREX) 500 MG tablet as directed.  1   No current facility-administered medications for this visit.     Family History  Problem Relation Age  of Onset  . Breast cancer Sister [id number]    first occurence age 346, second at age 54  . Breast cancer Maternal Aunt     post menopausal  . Pancreatic cancer Mother 7678  . Breast cancer Paternal Aunt     post menoausal    ROS:  Pertinent items are noted in HPI.  Otherwise, a comprehensive ROS was negative.  Exam:   LMP 05/05/2013 (Approximate)    Ht Readings from Last 3 Encounters:  02/04/15 5' 7.5" (1.715 m)  01/28/14 5' 7.75" (1.721 m)  12/11/12 5' 7.5" (1.715 m)    General appearance: alert, cooperative and appears stated age Head: Normocephalic, without obvious abnormality, atraumatic Neck: no adenopathy, supple, symmetrical, trachea midline and thyroid normal to inspection and palpation Lungs: clear to auscultation bilaterally Breasts: normal appearance, no masses or tenderness Heart: regular rate and rhythm Abdomen: soft, non-tender; no masses,  no organomegaly Extremities: extremities normal, atraumatic, no cyanosis or edema Skin: Skin color, texture, turgor normal. No rashes or lesions Lymph nodes: Cervical, supraclavicular, and axillary nodes normal. No abnormal inguinal nodes palpated Neurologic: Grossly normal   Pelvic: External genitalia:  no lesions              Urethra:  normal appearing urethra with no masses, tenderness or lesions  Bartholin's and Skene's: normal                 Vagina: normal appearing vagina with normal color and white discharge, no lesions, no bleeding              Cervix: anteverted              Pap taken: No. Bimanual Exam:  Uterus:  normal size, contour, position, consistency, mobility, non-tender              Adnexa: no mass, fullness, tenderness               Rectovaginal: Confirms               Anus:  normal sphincter tone, no lesions  Chaperone present: yes  A:  Well Woman with normal exam:  Menopausal  Recent AUB with PUS on 12/25/15 with 8 mm endo lining Amenorrhea since 05/2013 with FSH 118.5 on  12/12/12 History of FCB FMH of breast cancer and pancreatic cancer R/O Vaginitis   P:   Reviewed health and wellness pertinent to exam  Pap smear as above  Mammogram is due and scheduled for 03/22/16  Will follow with labs  Will refer her to genetic counselor  Will consult with Dr, Mayme GentaMiller  Counseled on breast self exam, mammography screening, adequate intake of calcium and vitamin D, diet and exercise, Kegel's exercises return annually or prn  An After Visit Summary was printed and given to the patient.

## 2016-03-01 NOTE — Patient Instructions (Signed)

## 2016-03-02 ENCOUNTER — Other Ambulatory Visit: Payer: Self-pay | Admitting: Nurse Practitioner

## 2016-03-02 LAB — VITAMIN D 25 HYDROXY (VIT D DEFICIENCY, FRACTURES): Vit D, 25-Hydroxy: 41 ng/mL (ref 30–100)

## 2016-03-02 LAB — WET PREP BY MOLECULAR PROBE
Candida species: NEGATIVE
Gardnerella vaginalis: POSITIVE — AB
Trichomonas vaginosis: NEGATIVE

## 2016-03-02 LAB — CANCER ANTIGEN 19-9: CA 19-9: 24 U/mL (ref <34)

## 2016-03-02 LAB — FOLLICLE STIMULATING HORMONE: FSH: 67.3 m[IU]/mL

## 2016-03-02 MED ORDER — METRONIDAZOLE 0.75 % VA GEL: 1.0000 | Freq: Every day | VAGINAL | 0 refills | Status: DC

## 2016-03-03 NOTE — Progress Notes (Signed)
Pt needs endometrial biopsy due to 8mm endometrium noted on PUS.  PMP x 2 years.  Reviewed personally.  Lum KeasM. Suzanne Calee Nugent, MD.

## 2016-03-07 ENCOUNTER — Other Ambulatory Visit: Payer: Self-pay | Admitting: Nurse Practitioner

## 2016-03-07 ENCOUNTER — Telehealth: Payer: Self-pay | Admitting: Nurse Practitioner

## 2016-03-07 DIAGNOSIS — N950 Postmenopausal bleeding: Secondary | ICD-10-CM

## 2016-03-07 NOTE — Telephone Encounter (Signed)
Agree with pt calling them.  She is also the pt who needs endo biopsy per Dr. Hyacinth MeekerMiller.

## 2016-03-07 NOTE — Telephone Encounter (Signed)
Left message that I would like for her to CB and discuss test results and recommendations per Dr. Hyacinth MeekerMiller.  The information to give her is :  [facility] at 67.3.  Because of bleeding even though minimal and even though her PUS shows only an 8 mm lining - she still needs endo biopsy per Dr. Hyacinth MeekerMiller.  Needs to schedule.  Pt has called back and given the information.  She is willing to schedule and order is placed.  She has questions about referral to genetics and has not heard back from them.

## 2016-03-07 NOTE — Telephone Encounter (Signed)
Returned call to patient regarding referral for genetic testing.  Patient stated a "kelly" with the cancer center was to [place]. I provided the patient with [facility]. Patient is agreeable to contacting their office to schedule recommended appointment.   Routing to Walt DisneyPatricia Grubb

## 2016-03-07 NOTE — Progress Notes (Signed)
Please schedule pt for this after authorization.

## 2016-03-11 NOTE — Telephone Encounter (Signed)
Patient started her cycle and would like to talk with a nurse about this before her upcoming biopsy.

## 2016-03-11 NOTE — Telephone Encounter (Signed)
Call patient to discuss benefits for a recommended procedure. Left Voicemail requesting a return call. Advised in message to ask for [place] or [place]

## 2016-03-11 NOTE — Progress Notes (Signed)
Please see telephone encounter dated 03/07/2016.

## 2016-03-11 NOTE — Telephone Encounter (Signed)
Spoke with patient. Patient states that 2 days ago she started having light bleeding again. "It is not normal bleeding. It is light, but I have seen some small clots too." Is wearing a mini pad that she is changing once per day. Having mild cramping. Denies any heavy bleeding. Advised patient of importance of scheduling EMB. Patient states she is waiting to hear regarding her benefits before scheduling. Declines to schedule before having benefit information. Advised I will speak with benefits department and will have someone return her call so that we may proceed with scheduling. Patient is agreeable.  Routing to [employer]zy Dixon for [employer]

## 2016-03-11 NOTE — Telephone Encounter (Signed)
Patient is asking for benefit information for her upcoming biopsy.

## 2016-03-11 NOTE — Telephone Encounter (Signed)
Spoke with pt regarding benefit for endometrial biopsy. Patient understood and agreeable.  Patient scheduled 04/12/16 with Dr Hyacinth MeekerMiller. Earlier appointment dates were offered, but patient declined stating "I'm swamped between now and first of the year taking care of a relative in a nursing home and being out of town".  Patient is aware of date, arrival time and cancellation. Patient had no further questions.   Routing to Dr Hyacinth MeekerMiller for final review

## 2016-03-22 ENCOUNTER — Ambulatory Visit
Admission: RE | Admit: 2016-03-22 | Discharge: 2016-03-22 | Disposition: A | Discharge status: Home / Self Care | Payer: No Typology Code available for payment source | Source: Ambulatory Visit | Attending: Nurse Practitioner | Admitting: Nurse Practitioner

## 2016-03-22 DIAGNOSIS — Z1231 Encounter for screening mammogram for malignant neoplasm of breast: Secondary | ICD-10-CM

## 2016-04-12 ENCOUNTER — Ambulatory Visit: Payer: PRIVATE HEALTH INSURANCE | Admitting: Obstetrics & Gynecology

## 2016-04-19 ENCOUNTER — Telehealth: Payer: Self-pay | Admitting: *Deleted

## 2016-04-19 NOTE — Telephone Encounter (Signed)
Follow-up call to patient regarding canceled endometrial biopsy appointment with Dr Hyacinth MeekerMiller from 04-12-16. (according to appt desk patient canceled due to death in the family). Per ROI, can leave message on cell number and voice mail confirms "Aram BeechamCynthia."  Left message of sympathy regarding death in family. Checking to see if she is ready to reschedule important follow-up appointment for assessment of bleeding. Left message to call back to Kennon RoundsSally and I will work with her schedule to get her in to see Dr Hyacinth MeekerMiller.

## 2016-05-03 NOTE — Telephone Encounter (Signed)
Follow-up call to patient. Per ROI, can leave detailed message on cell number. Left message calling to follow up on cancelled appointment and we will be happy to assist her in rescheduling. We understand she has had extenuating circumstances so we will work with her to accommodate her schedule needs. Just want to be sure she knows importance of the importance and getting it rescheduled at her earliest convenience. Left message to call back and ask for [facility].

## 2016-05-10 NOTE — Telephone Encounter (Signed)
Follow-up call to patient regarding canceled endometrial biopsy. Voice mail confirms "Aram BeechamCynthia."  Left message to call back regarding rescheduling of important appointment.

## 2016-06-12 ENCOUNTER — Encounter: Payer: Self-pay | Admitting: *Deleted

## 2016-06-16 NOTE — Telephone Encounter (Signed)
Reviewed with Dr Hyacinth MeekerMiller. Letter with Dr Rondel BatonMiller's  recommendations mailed certified and regular [place] mail.  Encounter closed.

## 2016-09-23 ENCOUNTER — Other Ambulatory Visit: Payer: Self-pay | Admitting: Obstetrics & Gynecology

## 2016-09-23 ENCOUNTER — Other Ambulatory Visit: Payer: Self-pay | Admitting: Nurse Practitioner

## 2016-09-23 MED ORDER — TRIMETHOPRIM 100 MG PO TABS: ORAL_TABLET | ORAL | 0 refills | Status: DC

## 2016-09-23 NOTE — Telephone Encounter (Signed)
Medication refill request: Bactrim Last AEX:  03-01-16  Next AEX: 03-08-17  Last MMG (if hormonal medication request): 03-22-16 WNL  Refill authorized: please advise   Sending to SM since PG and DL are out of the office

## 2016-09-23 NOTE — Telephone Encounter (Signed)
Patient calling for a refill on sulfamethox[facility] in [place]. Pharmacy number is [phone]

## 2016-09-23 NOTE — Telephone Encounter (Signed)
Please let pt know that rx was sent into pharamcy.  She's been getting sulfamethoxazole/trimethoprim.  For post-coital prophylaxis for UTI, trimethoprim should be enough so rx changed to this.    Also, can you please see if she's had any other bleeding.  She had episode of PMP bleeding and biopsy was recommended but she declined to have this done.  I'm wondering if this has occurred again.  Thanks.

## 2016-09-26 NOTE — Telephone Encounter (Signed)
Spoke with patient and informed her that RX was sent in. Patient states she has not had anymore bleeding since her last visit.

## 2017-02-20 ENCOUNTER — Other Ambulatory Visit: Payer: Self-pay

## 2017-02-20 ENCOUNTER — Other Ambulatory Visit: Payer: Self-pay | Admitting: Nurse Practitioner

## 2017-02-20 DIAGNOSIS — Z1231 Encounter for screening mammogram for malignant neoplasm of breast: Secondary | ICD-10-CM

## 2017-03-08 ENCOUNTER — Ambulatory Visit: Payer: PRIVATE HEALTH INSURANCE | Admitting: Nurse Practitioner

## 2017-03-16 ENCOUNTER — Encounter: Payer: Self-pay | Admitting: Certified Nurse Midwife

## 2017-03-16 ENCOUNTER — Other Ambulatory Visit (HOSPITAL_COMMUNITY)
Admission: RE | Admit: 2017-03-16 | Discharge: 2017-03-16 | Disposition: A | Discharge status: Home / Self Care | Payer: No Typology Code available for payment source | Source: Ambulatory Visit | Attending: Obstetrics & Gynecology | Admitting: Obstetrics & Gynecology

## 2017-03-16 ENCOUNTER — Other Ambulatory Visit: Payer: Self-pay

## 2017-03-16 ENCOUNTER — Ambulatory Visit (INDEPENDENT_AMBULATORY_CARE_PROVIDER_SITE_OTHER): Payer: BLUE CROSS/BLUE SHIELD | Admitting: Certified Nurse Midwife

## 2017-03-16 VITALS — BP 110/82 | HR 78 | Ht 67.5 in | Wt 164.0 lb

## 2017-03-16 DIAGNOSIS — E559 Vitamin D deficiency, unspecified: Secondary | ICD-10-CM

## 2017-03-16 DIAGNOSIS — Z01419 Encounter for gynecological examination (general) (routine) without abnormal findings: Secondary | ICD-10-CM

## 2017-03-16 DIAGNOSIS — N951 Menopausal and female climacteric states: Secondary | ICD-10-CM

## 2017-03-16 DIAGNOSIS — N841 Polyp of cervix uteri: Secondary | ICD-10-CM | POA: Diagnosis not present

## 2017-03-16 DIAGNOSIS — Z124 Encounter for screening for malignant neoplasm of cervix: Secondary | ICD-10-CM

## 2017-03-16 DIAGNOSIS — Z0000 Encounter for general adult medical examination without abnormal findings: Secondary | ICD-10-CM

## 2017-03-16 DIAGNOSIS — Z8481 Family history of carrier of genetic disease: Secondary | ICD-10-CM

## 2017-03-16 NOTE — Patient Instructions (Signed)

## 2017-03-16 NOTE — Progress Notes (Signed)
56 y.o. [id number] Married  Caucasian Fe here for annual exam. Menopausal, no HRT. Occasional hot flashes, no issues. Denies vaginal bleeding Uses  vaginal dryness. Sees Dr. Shary Decamp PCP for , medication management of asthma, all stable. History of post coital UTI, no issues in a long time., needs update on Rx. Patient was having leg edema, but changed chairs and this has improved. Ran/walked in 5 K with some increase in asthma. Occasional urinary stress incontinence with running only. Has family history of colon cancer 19-9 antigen and tests yearly requests screening labs today. No other health issues today.  Patient's last menstrual period was 05/05/2013 (approximate).          Sexually active: Yes.    The current method of family planning is tubal ligation.    Exercising: Yes.    walking, running Smoker:  no  Health Maintenance: Pap:  02-04-15 neg HPV HR neg History of Abnormal Pap: no MMG:  03-22-16 category c density birads 1:neg, has scheduled 04/18/17 Self Breast exams: occasionally  Colonoscopy:  5/14 neg f/u 21yrs BMD:   none TDaP:  2013 Shingles: never  Pneumonia: never  Hep C and HIV: both neg 2016 Labs: discuss with provider    reports that  has never smoked. she has never used smokeless tobacco. She reports that she does not drink alcohol or use drugs.  Past Medical History:  Diagnosis Date  . Fibrocystic breast changes   . Urethritis    post coital    Past Surgical History:  Procedure Laterality Date  . APPENDECTOMY  1976  . TUBAL LIGATION Bilateral 1994  . URETER SURGERY  1968   reconstruction  . WISDOM TOOTH EXTRACTION  1981    Current Outpatient Medications  Medication Sig Dispense Refill  . albuterol (PROVENTIL HFA;VENTOLIN HFA) 108 (90 Base) MCG/ACT inhaler Inhale into the lungs as needed.     Marland Kitchen azelastine (ASTELIN) 0.1 % nasal spray Place into the nose as needed.     . cetirizine (ZYRTEC) 10 MG tablet Take 10 mg by mouth daily.    . metroNIDAZOLE (METROGEL)  0.75 % vaginal gel Place 1 Applicatorful vaginally at bedtime. (Patient taking differently: Place 1 Applicatorful vaginally as needed. ) 70 g 0  . trimethoprim (TRIMPEX) 100 MG tablet 1 tab po post coitally to prevent infection 30 tablet 0  . valACYclovir (VALTREX) 500 MG tablet as directed.  1   No current facility-administered medications for this visit.     Family History  Problem Relation Age of Onset  . Breast cancer Sister 110       first occurence age 83, second at age 61  . Pancreatic cancer Mother 17  . Breast cancer Maternal Aunt        post menopausal  . Breast cancer Paternal Aunt        post menoausal    ROS:  Pertinent items are noted in HPI.  Otherwise, a comprehensive ROS was negative.  Exam:   BP 110/82 (BP Location: Right Arm, Patient Position: Sitting, Cuff Size: Normal)   Pulse 78   Ht 5' 7.5" (1.715 m)   Wt 164 lb (74.4 kg)   LMP 05/05/2013 (Approximate)   HC 16" (40.6 cm)   BMI 25.31 kg/m  Height: 5' 7.5" (171.5 cm) Ht Readings from Last 3 Encounters:  03/16/17 5' 7.5" (1.715 m)  03/01/16 5' 7.25" (1.708 m)  02/04/15 5' 7.5" (1.715 m)    General appearance: alert, cooperative and appears stated age Head: Normocephalic, without  obvious abnormality, atraumatic Neck: no adenopathy, supple, symmetrical, trachea midline and thyroid normal to inspection and palpation Lungs: clear to auscultation bilaterally Breasts: normal appearance, no masses or tenderness, No nipple retraction or dimpling, No nipple discharge or bleeding, No axillary or supraclavicular adenopathy Heart: regular rate and rhythm Abdomen: soft, non-tender; no masses,  no organomegaly Extremities: extremities normal, atraumatic, no cyanosis or edema Skin: Skin color, texture, turgor normal. No rashes or lesions Lymph nodes: Cervical, supraclavicular, and axillary nodes normal. No abnormal inguinal nodes palpated Neurologic: Grossly normal   Pelvic: External genitalia:  no lesions               Urethra:  normal appearing urethra with no masses, tenderness or lesions              Bartholin's and Skene's: normal                 Vagina: normal appearing vagina with normal color and discharge, no lesions              Cervix: no cervical motion tenderness and cervical polyp very small noted in cervix, not friable              Pap taken: Yes.   Bimanual Exam:  Uterus:  normal size, contour, position, consistency, mobility, non-tender              Adnexa: normal adnexa and no mass, fullness, tenderness               Rectovaginal: Confirms               Anus:  normal sphincter tone, no lesions  Chaperone present: yes  A:  Well Woman with normal exam  Menopausal no HRT  Cervical polyp  History of Herpes oral with frequent occurrence needs up date on RX  History of Post coital UTI, Trimethoprim working well needs Rx  Update   Asthma management with PCP, needs appointment  Family history of colon cancer antigen 19-9  Screening labs  P:   Reviewed health and wellness pertinent to exam  Aware of need to evaluate if vaginal bleeding  Discussed benign finding of cervical polyp and etiology and warning signs and need to advise if occurs. Questions addressed.  Rx Valtrex see order with instructions  Rx Trimethoprim see order with instructions  Stressed importance of follow up with PCP  Labs: Colon cancer 19-9, Lipid panel, CMP, TSH, Vitamin D  Pap smear: yes   counseled on breast self exam, mammography screening, feminine hygiene, menopause, adequate intake of calcium and vitamin D, diet and exercise  return annually or prn  An After Visit Summary was printed and given to the patient.

## 2017-03-17 LAB — CANCER ANTIGEN 19-9: CAN 19-9: 27 U/mL (ref 0–35)

## 2017-03-17 LAB — COMPREHENSIVE METABOLIC PANEL
ALT: 22 IU/L (ref 0–32)
AST: 27 IU/L (ref 0–40)
Albumin/Globulin Ratio: 2.1 (ref 1.2–2.2)
Albumin: 4.9 g/dL (ref 3.5–5.5)
Alkaline Phosphatase: 55 IU/L (ref 39–117)
BUN/Creatinine Ratio: 24 — ABNORMAL HIGH (ref 9–23)
BUN: 19 mg/dL (ref 6–24)
Bilirubin Total: 0.7 mg/dL (ref 0.0–1.2)
CALCIUM: 10 mg/dL (ref 8.7–10.2)
CO2: 25 mmol/L (ref 20–29)
Chloride: 101 mmol/L (ref 96–106)
Creatinine, Ser: 0.78 mg/dL (ref 0.57–1.00)
GFR, EST AFRICAN AMERICAN: 99 mL/min/{1.73_m2} (ref >59)
GFR, EST NON AFRICAN AMERICAN: 86 mL/min/{1.73_m2} (ref >59)
GLUCOSE: 90 mg/dL (ref 65–99)
Globulin, Total: 2.3 g/dL (ref 1.5–4.5)
Potassium: 4.7 mmol/L (ref 3.5–5.2)
Sodium: 142 mmol/L (ref 134–144)
TOTAL PROTEIN: 7.2 g/dL (ref 6.0–8.5)

## 2017-03-17 LAB — CYTOLOGY - PAP: Diagnosis: NEGATIVE

## 2017-03-17 LAB — LIPID PANEL
Chol/HDL Ratio: 2.9 ratio (ref 0.0–4.4)
Cholesterol, Total: 168 mg/dL (ref 100–199)
HDL: 57 mg/dL
LDL Calculated: 97 mg/dL (ref 0–99)
Triglycerides: 69 mg/dL (ref 0–149)
VLDL Cholesterol Cal: 14 mg/dL (ref 5–40)

## 2017-03-17 LAB — VITAMIN D 25 HYDROXY (VIT D DEFICIENCY, FRACTURES): Vit D, 25-Hydroxy: 33.2 ng/mL (ref 30.0–100.0)

## 2017-03-17 LAB — TSH: TSH: 1.97 u[IU]/mL (ref 0.450–4.500)

## 2017-04-18 ENCOUNTER — Ambulatory Visit
Admission: RE | Admit: 2017-04-18 | Discharge: 2017-04-18 | Disposition: A | Discharge status: Home / Self Care | Payer: No Typology Code available for payment source | Source: Ambulatory Visit | Attending: Nurse Practitioner | Admitting: Nurse Practitioner

## 2017-04-18 DIAGNOSIS — Z1231 Encounter for screening mammogram for malignant neoplasm of breast: Secondary | ICD-10-CM

## 2017-12-13 ENCOUNTER — Telehealth: Payer: Self-pay | Admitting: Genetic Counselor

## 2017-12-13 ENCOUNTER — Encounter: Payer: Self-pay | Admitting: Genetic Counselor

## 2017-12-13 ENCOUNTER — Other Ambulatory Visit: Payer: BLUE CROSS/BLUE SHIELD

## 2017-12-13 NOTE — Telephone Encounter (Signed)
Pt cld wanting to schedule a genetic counseling appt. Pt has been scheduled to see Fuller Plan on 10/9 at 2pm. Pt aware to arrive 30 minutes early to be checked in on time. Letter mailed.

## 2018-01-10 ENCOUNTER — Other Ambulatory Visit: Payer: Self-pay

## 2018-01-10 ENCOUNTER — Encounter: Payer: BLUE CROSS/BLUE SHIELD | Admitting: Genetic Counselor

## 2018-01-10 ENCOUNTER — Inpatient Hospital Stay: Payer: BLUE CROSS/BLUE SHIELD

## 2018-01-10 ENCOUNTER — Inpatient Hospital Stay: Payer: BLUE CROSS/BLUE SHIELD | Attending: Genetic Counselor | Admitting: Genetic Counselor

## 2018-01-10 DIAGNOSIS — Z803 Family history of malignant neoplasm of breast: Secondary | ICD-10-CM

## 2018-01-10 DIAGNOSIS — Z800 Family history of malignant neoplasm of digestive organs: Secondary | ICD-10-CM | POA: Diagnosis not present

## 2018-01-11 ENCOUNTER — Encounter: Payer: Self-pay | Admitting: Genetic Counselor

## 2018-01-11 DIAGNOSIS — Z803 Family history of malignant neoplasm of breast: Secondary | ICD-10-CM | POA: Insufficient documentation

## 2018-01-11 NOTE — Progress Notes (Signed)
REFERRING PROVIDER: Raina Mina., MD [place], [place] [address]  PRIMARY PROVIDER:  Raina Mina., MD  PRIMARY REASON FOR VISIT:  1. Family history of pancreatic cancer   2. Family history of breast cancer      HISTORY OF PRESENT ILLNESS:   Angela Duarte, a 57 y.o. female, was seen for a [place] cancer genetics consultation at the request of Dr. Bea Graff due to a family history of cancer.  Angela Duarte presents to clinic today to discuss the possibility of a hereditary predisposition to cancer, genetic testing, and to further clarify her future cancer risks, as well as potential cancer risks for family members.   Angela Duarte is a 57 y.o. female with no personal history of cancer.  Her sister underwent genetic testing and was found to have a PALB2 mutation.  Angela Duarte was referred to learn about her risk for having a PALB2 mutation and undergo genetic testing.  CANCER HISTORY:   No history exists.     HORMONAL RISK FACTORS:  Menarche was at age 62.  First live birth at age 22.  OCP use for approximately 7-10 years.  Ovaries intact: yes.  Hysterectomy: no.  Menopausal status: postmenopausal.  HRT use: 0 years. Colonoscopy: yes; normal. Mammogram within the last year: yes. Number of breast biopsies: 0. Up to date with pelvic exams:  yes. Any excessive radiation exposure in the past:  no  Past Medical History:  Diagnosis Date  . Family history of breast cancer   . Fibrocystic breast changes   . Urethritis    post coital    Past Surgical History:  Procedure Laterality Date  . APPENDECTOMY  1976  . TUBAL LIGATION Bilateral 1994  . URETER SURGERY  1968   reconstruction  . [place] EXTRACTION  1981    Social History   Socioeconomic History  . Marital status: Married    Spouse name: Caryl Pina  . Number of children: 2  . Years of education: Not on file  . Highest education level: Not on file  Occupational History    Employer: [employer]  Social Needs  . Financial resource strain: Not on file  . Food insecurity:    Worry: Not on file    Inability: Not on file  . Transportation needs:    Medical: Not on file    Non-medical: Not on file  Tobacco Use  . Smoking status: Never Smoker  . Smokeless tobacco: Never Used  Substance and Sexual Activity  . Alcohol use: No  . Drug use: No  . Sexual activity: Yes    Partners: Male    Birth control/protection: Surgical    Comment: BTL  Lifestyle  . Physical activity:    Days per week: Not on file    Minutes per session: Not on file  . Stress: Not on file  Relationships  . Social connections:    Talks on phone: Not on file    Gets together: Not on file    Attends religious service: Not on file    Active member of club or organization: Not on file    Attends meetings of clubs or organizations: Not on file    Relationship status: Not on file  Other Topics Concern  . Not on file  Social History Narrative  . Not on file     FAMILY HISTORY:  We obtained a detailed, 4-generation family history.  Significant diagnoses are listed below: Family History  Problem Relation  Age of Onset  . Breast cancer Sister 62       first occurence age 45, second at age 58  . Pancreatic cancer Mother 39  . Breast cancer Maternal Aunt        post menopausal  . Breast cancer Paternal Aunt        post menoausal  . Breast cancer Maternal Aunt        dx over 49  . Lung cancer Maternal Aunt   . Bladder Cancer Maternal Aunt   . Breast cancer Cousin        mat first cousin  . Breast cancer Cousin        2 additional mat first cousin  . Lung cancer Paternal Uncle   . Breast cancer Paternal Aunt        dx over 52  . Breast cancer Cousin        pat cousin with possible breast cancer    The patient has two sons who are cancer free.  She has a brother and sister.  Her sister had breast cancer at 31 and 36 and was found to have a PALB2 mutation.  This sister had a daughter who had  testing and is also PALB2 pos.  Both parents are deceased.  The patient's mother had pancreatic cancer at age 40.  She had 13 siblings.  Two sisters had breast cancer and a sister had lung and kidney/bladder cancer.  One of the sisters with breast cancer has two daughters with breast cancer, and one of unaffected siblings has a daughter with breast cancer.  Both maternal grandparents are deceased from non cancer related issues.  The patient's father died by suicide.  He had seven siblings.  Two sisters had breast cancer and one of their daughters also had breast cancer.  One brother had lung cancer. Both paternal grandparents are deceased from non cancer related issues.   Angela Duarte is aware of previous family history of genetic testing for hereditary cancer risks. Patient's maternal ancestors are of Caucasian NOS descent, and paternal ancestors are of Caucasian NOS descent. There is no reported Ashkenazi Jewish ancestry. There is no known consanguinity.  GENETIC COUNSELING ASSESSMENT: Angela Duarte is a 57 y.o. 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 We, therefore, discussed and recommended the following at today's visit.   DISCUSSION: We discussed that about 5-10% of breast cancer is hereditary.  Her sister was found to have a hereditary form of breast cancer due to a PALB2 c.758dupT.  We discussed that PALB2 is a moderate risk gene, increasing the risk for breast cancer up to 55-60% lifetime risk for breast cancer, as well as increasing the risk for both pancreatic and prostate cancer.  PALB2 is inherited in a dominant fashion and therefore, the patient is at a 50% risk for also having this mutation.  She has a Bilineal risk for breast cancer based.  Therefore we discussed having a larger panel to see if there are other gene mutations running in the  family.  We reviewed the characteristics, features and inheritance patterns of hereditary cancer syndromes. We also discussed genetic testing, including the appropriate family members to test, the process of testing, insurance coverage and turn-around-time for results. We discussed the implications of a negative, positive and/or variant of uncertain significant result. In order to get genetic test results in a timely  manner so that Angela Duarte can use these genetic test results for surgical decisions, we recommended Angela Duarte pursue genetic testing for the 9-gene STAT panel. If this test is negative, we then recommend Angela Duarte pursue reflex genetic testing to the multi-cancer gene panel. The Multi-Gene Panel offered by [facility] includes sequencing and/or deletion duplication testing of the following 84 genes: AIP, ALK, APC, ATM, AXIN2,BAP1,  BARD1, BLM, BMPR1A, BRCA1, BRCA2, BRIP1, CASR, CDC73, CDH1, CDK4, CDKN1B, CDKN1C, CDKN2A (p14ARF), CDKN2A (p16INK4a), CEBPA, CHEK2, CTNNA1, DICER1, DIS3L2, EGFR (c.2369C>T, p.Thr790Met variant only), EPCAM (Deletion/duplication testing only), FH, FLCN, GATA2, GPC3, GREM1 (Promoter region deletion/duplication testing only), HOXB13 (c.251G>A, p.Gly84Glu), HRAS, KIT, MAX, MEN1, MET, MITF (c.952G>A, p.Glu318Lys variant only), MLH1, MSH2, MSH3, MSH6, MUTYH, NBN, NF1, NF2, NTHL1, PALB2, PDGFRA, PHOX2B, PMS2, POLD1, POLE, POT1, PRKAR1A, PTCH1, PTEN, RAD50, RAD51C, RAD51D, RB1, RECQL4, RET, RUNX1, SDHAF2, SDHA (sequence changes only), SDHB, SDHC, SDHD, SMAD4, SMARCA4, SMARCB1, SMARCE1, STK11, SUFU, TERC, TERT, TMEM127, TP53, TSC1, TSC2, VHL, WRN and WT1.    Based on Angela Duarte's personal and family history of cancer, she meets medical criteria for genetic testing. Despite that she meets criteria, she may still have an out of pocket cost. We discussed that if her out of pocket cost for testing is over $100, the laboratory will call and confirm whether she wants to proceed  with testing.  If the out of pocket cost of testing is less than $100 she will be billed by the genetic testing laboratory.   PLAN: After considering the risks, benefits, and limitations, Angela Duarte  provided informed consent to pursue genetic testing and the blood sample was sent to [facility] for analysis of the Multi cancer gene panel. Results should be available within approximately 2-3 weeks' time, at which point they will be disclosed by telephone to Angela Duarte, as will any additional recommendations warranted by these results. Angela Duarte will receive a summary of her genetic counseling visit and a copy of her results once available. This information will also be available in Epic. We encouraged Angela Duarte to remain in contact with cancer genetics annually so that we can continuously update the family history and inform her of any changes in cancer genetics and testing that may be of benefit for her family. Angela Duarte questions were answered to her satisfaction today. Our contact information was provided should additional questions or concerns arise.  Lastly, we encouraged Angela Duarte to remain in contact with cancer genetics annually so that we can continuously update the family history and inform her of any changes in cancer genetics and testing that may be of benefit for this family.   Ms.  Duarte questions were answered to her satisfaction today. Our contact information was provided should additional questions or concerns arise. Thank you for the referral and allowing [place] to share in the care of your patient.   Karen P. Florene Glen, [place], [facility] Certified Genetic Counselor [email or web address] phone: [phone]  The patient was seen for a total of 45 minutes in face-to-face genetic counseling.  This patient was discussed with Drs. Magrinat, Lindi Adie and/or Burr Medico who agrees with the above.    _______________________________________________________________________ For  Office Staff:  Number of people involved in session: 1 Was an Intern/ student involved with case: no

## 2018-01-18 ENCOUNTER — Encounter: Payer: Self-pay | Admitting: Genetic Counselor

## 2018-01-18 ENCOUNTER — Telehealth: Payer: Self-pay | Admitting: Genetic Counselor

## 2018-01-18 DIAGNOSIS — Z1379 Encounter for other screening for genetic and chromosomal anomalies: Secondary | ICD-10-CM | POA: Insufficient documentation

## 2018-01-18 NOTE — Telephone Encounter (Signed)
LM on VM that results are back and to please CB. 

## 2018-01-22 NOTE — Progress Notes (Signed)
Thank you for seeing patient. Should she be counseled regarding MRI breast screening with this increase risk. Lovett Sox, CNM

## 2018-01-22 NOTE — Progress Notes (Signed)
That would be fine. Thank you Debbi

## 2018-01-26 ENCOUNTER — Inpatient Hospital Stay (HOSPITAL_BASED_OUTPATIENT_CLINIC_OR_DEPARTMENT_OTHER): Payer: BLUE CROSS/BLUE SHIELD | Admitting: Genetic Counselor

## 2018-01-26 DIAGNOSIS — Z1379 Encounter for other screening for genetic and chromosomal anomalies: Secondary | ICD-10-CM | POA: Diagnosis not present

## 2018-01-26 NOTE — Progress Notes (Addendum)
GENETIC TEST RESULTS   Patient Name: Angela Duarte Patient Age: 57 y.o. Encounter Date: 01/26/2018  Referring Provider: Gilford Rile, MD  Christena Flake    Angela Duarte was seen in the [place] clinic on January 26, 2018 due to a family history of cancer and concern regarding a hereditary predisposition to cancer in the family.  She was seen with her sister, Shelton Silvas, who has also tested positive for the PALB2 familial mutation. Please refer to the prior Genetics clinic note for more information regarding Angela Duarte's medical and family histories and our assessment at the time.   FAMILY HISTORY:  We obtained a detailed, 4-generation family history.  Significant diagnoses are listed below: Family History  Problem Relation Age of Onset  . Breast cancer Sister 55       first occurence age 104, second at age 44  . Pancreatic cancer Mother 69  . Breast cancer Maternal Aunt        post menopausal  . Breast cancer Paternal Aunt        post menoausal  . Breast cancer Maternal Aunt        dx over 84  . Lung cancer Maternal Aunt   . Bladder Cancer Maternal Aunt   . Breast cancer Cousin        mat first cousin  . Breast cancer Cousin        2 additional mat first cousin  . Lung cancer Paternal Uncle   . Breast cancer Paternal Aunt        dx over 3  . Breast cancer Cousin        pat cousin with possible breast cancer    The patient has two sons who are cancer free.  She has a brother and sister.  Her sister had breast cancer at 54 and 22 and was found to have a PALB2 mutation.  This sister had a daughter who had testing and is also PALB2 pos.  Both parents are deceased.  The patient's mother had pancreatic cancer at age 38.  She had 13 siblings.  Two sisters had breast cancer and a sister had lung and kidney/bladder cancer.  One of the sisters with breast cancer has two daughters with breast cancer, and one of unaffected siblings has a daughter with breast cancer.  Both  maternal grandparents are deceased from non cancer related issues.  The patient's father died by suicide.  He had seven siblings.  Two sisters had breast cancer and one of their daughters also had breast cancer.  One brother had lung cancer. Both paternal grandparents are deceased from non cancer related issues.   Angela Duarte is aware of previous family history of genetic testing for hereditary cancer risks. Patient's maternal ancestors are of Caucasian NOS descent, and paternal ancestors are of Caucasian NOS descent. There is no reported Ashkenazi Jewish ancestry. There is no known consanguinity.  GENETIC TESTING:  At the time of Angela Duarte's visit, we recommended she pursue genetic testing for the Multicancer panel to identify the PALB2 mutation that was identified in the family. 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 There were no deleterious mutations in  AIP, ALK, APC, ATM, AXIN2,BAP1,  BARD1, BLM, BMPR1A, BRCA1, BRCA2, BRIP1, CASR, CDC73, CDH1, CDK4, CDKN1B, CDKN1C, CDKN2A (p14ARF), CDKN2A (p16INK4a), CEBPA, CHEK2, CTNNA1, DICER1, DIS3L2, EGFR (c.2369C>T, p.Thr790Met variant only), EPCAM (Deletion/duplication testing  only), FH, FLCN, GATA2, GPC3, GREM1 (Promoter region deletion/duplication testing only), HOXB13 (c.251G>A, p.Gly84Glu), HRAS, KIT, MAX, MEN1, MET, MITF (c.952G>A, p.Glu318Lys variant only), MLH1, MSH2, MSH3, MSH6, MUTYH, NBN, NF1, NF2, NTHL1, PDGFRA, PHOX2B, PMS2, POLD1, POLE, POT1, PRKAR1A, PTCH1, PTEN, RAD50, RAD51C, RAD51D, RB1, RECQL4, RET, RUNX1, SDHAF2, SDHA (sequence changes only), SDHB, SDHC, SDHD, SMAD4, SMARCA4, SMARCB1, SMARCE1, STK11, SUFU, TERC, TERT, TMEM127, TP53, TSC1, TSC2, VHL, WRN and WT1.    Genetic testing did detect two Variants of Unknown Significance - one in the GATA2 gene called c.1347C>A  (Silent) and the other in the RECQL4 gene, called c.2058+5G>A (Intronic). The latter is a variant in the intronic region. At this time, it is unknown if these variants are associated with increased cancer risk or if they are a normal finding, but most variants such as these get reclassified to being inconsequential. They should not be used to make medical management decisions. With time, we suspect the lab will determine the significance of these variants, if any. If we do learn more about them, we will try to contact Angela Duarte to discuss it further. However, it is important to stay in touch with [place] periodically and keep the address and phone number up to date.  Clinical condition The risk of breast cancer in women with a single pathogenic PALB2 variant is 33-58% by age 16, with higher risks among those with a greater number of relatives with breast cancer (PMID: 33007622, 63335456, 25638937, 34287681). One study found the risk of developing contralateral breast cancer is approximately 10% within five years after the initial diagnosis of breast cancer among individuals with a pathogenic variant in [place] (PMID: 15726203).  For both men and women, there is also an increased risk for pancreatic cancer, however, specific risk figures are not yet established (PMID: 55974163, 84536468, 03212248). Additional data suggests an increased risk of ovarian cancer (PMID: 25003704, 88891694) and female breast cancer (PMID: 50388828, 00349179, 15056979), although this evidence is limited and emerging.  Gene information PALB2 is a tumor-suppressor gene, meaning its function is to help control the rate of growth and cell division in the body. The protein product plays a critical role in homologous recombination repair (HRR) through its ability to recruit BRCA2 and RAD51 to DNA breaks (Uniprot: PALB2_HUMAN,Q86YC2 ReportMortgages.tn. Accessed January 2017). If there is a pathogenic variant in this gene that  prevents it from functioning normally, the risk of developing certain types of cancers may be increased.  Inheritance Hereditary predisposition to cancer due to pathogenic variants in the PALB2 gene has autosomal dominant inheritance. This means that an individual with a pathogenic variant has a 50% chance of passing the condition on to their offspring. Once a pathogenic mutation is detected in an individual, it is possible to identify at-risk relatives who can pursue testing for this specific familial variant. Many cases are inherited from a parent, but some cases may occur spontaneously (i.e., an individual with a pathogenic variant who has parents who do not have it).  Individuals with a single pathogenic PALB2 variant are also carriers of autosomal recessive Fanconi anemia type N. Fanconi anemia is characterized by bone marrow failure with variable additional anomalies, which often include short stature, abnormal skin pigmentation, abnormal thumbs, malformations of the skeletal and central nervous systems, and developmental delay (PMID: 4801655, 37482707). Risk of leukemia and early onset solid tumors is significantly elevated with this disorder (PMID: 86754492, 01007121, 97588325). For there to be a risk of Fanconi anemia in offspring, both the patient and their partner  would each have to carry a pathogenic variant in PALB2; in this case, the risk to have an affected child is 25%.  MEDICAL MANAGEMENT: The [place] (NCCN) has published screening and surveillance guidelines for women with a single pathogenic variant in [place] (NCCN. Genetic/Familial High-Risk Assessment: Breast and Ovarian. Version 1.2018):  - Annual mammography with consideration of tomosynthesis beginning at age 36 . Consider annual breast MRI with contrast starting at age 26, with modification as appropriate based on family history . Prophylactic risk-reducing mastectomy: consider based on family  history (evidence insufficient; manage based on family history)  - NCCN cites insufficient evidence to warrant screening for ovarian and pancreatic cancer (NCCN. Genetic/Familial High-Risk Assessment: Breast and Ovarian. Version 3.2019). In contrast, the [employer] of Gastroenterology Clinical Guidelines recommend pancreatic cancer screening in PALB2 carriers be limited to those with a first- or second-degree relative affected with pancreatic cancer. Ideally, screening should be performed in experienced centers utilizing a multidisciplinary approach under research conditions. Recommended screening includes annual endoscopic ultrasound and/or MRI of the pancreas starting at age 50 or 44 years younger than the earliest age of pancreatic cancer diagnosis in the family (PMID: 34196222).  Overall cancer risk assessment incorporates additional factors including personal medical history, family history, and any available genetic information that may result in a personalized plan for cancer prevention and surveillance.  Angela Duarte is eligible to be followed in the high risk breast clinic.  Based on her family history of pancreatic cancer, she is interested in considering high risk pancreatic cancer screening.  Therefore, we will refer her to Dr. Burr Medico, who participated not only in the high risk breast clinic, but also is the lead for the pancreatic cancer screening program.  FAMILY MEMBERS: It is important that all of Angela Duarte's relatives (both men and women) know of the presence of this gene mutation. Site-specific genetic testing can sort out who in the family is at risk and who is not. Knowing if a PALB2 pathogenic variant is present is advantageous. At-risk relatives can be identified, enabling pursuit of a diagnostic evaluation. Further, the available information regarding hereditary cancer susceptibility genes is constantly evolving and more clinically relevant data regarding PALB2 are likely to  become available in the near future. Awareness of this cancer predisposition encourages patients and their providers to inform at-risk family members, to diligently follow recommended screening protocols, and to be vigilant in maintaining close and regular contact with their local genetics clinic in anticipation of new information.  Angela Duarte children and siblings have a 50% chance to have inherited this mutation. We recommend they have genetic testing for this same mutation, as identifying the presence of this mutation would allow them to also take advantage of risk-reducing measures.   Based on Angela Duarte' testing, the laboratory [facility] will test any family member's sample for free for up to 90 days after her report date of January 24, 2018.  This includes not only her first degree relatives, but also more extended relatives like cousins   SUPPORT AND RESOURCES: If Angela Duarte is interested in PALB2-information and support, there are two groups, Facing Our Risk (www.facingourrisk.com) and Bright Pink (www.brightpink.org) which some people have found useful. They provide opportunities to speak with other individuals from high-risk families. To locate genetic counselors in other cities, visit the website of the [employer] (ArtistMovie.se) and Secretary/administrator for a Social worker by zip code.  We encouraged Angela Duarte to remain in contact with [place] on an  annual basis so we can update her personal and family histories, and let her know of advances in cancer genetics that may benefit the family. Our contact number was provided. Angela Duarte questions were answered to her satisfaction today, and she knows she is welcome to call anytime with additional questions.   Django Nguyen P. [place], [place], [facility] Certified Genetic Counselor [email or web address] phone: [phone]

## 2018-02-22 ENCOUNTER — Telehealth: Payer: Self-pay | Admitting: Hematology

## 2018-02-22 ENCOUNTER — Encounter: Payer: Self-pay | Admitting: Hematology

## 2018-02-22 NOTE — Telephone Encounter (Signed)
Pt has been scheduled to see Dr. Mosetta PuttFeng for high risk clinic on 05/02/18 at 230pm. Pt agreed to the appt date and time. Letter mailed.

## 2018-04-25 ENCOUNTER — Telehealth: Payer: Self-pay | Admitting: Hematology

## 2018-04-25 NOTE — Telephone Encounter (Signed)
Pt cld to reschedule appt to 3/10 at 230pm w/Dr. Mosetta Putt

## 2018-04-26 ENCOUNTER — Ambulatory Visit: Payer: BLUE CROSS/BLUE SHIELD | Admitting: Allergy and Immunology

## 2018-04-26 ENCOUNTER — Ambulatory Visit: Payer: BLUE CROSS/BLUE SHIELD | Admitting: Certified Nurse Midwife

## 2018-04-30 ENCOUNTER — Encounter: Payer: Self-pay | Admitting: Allergy and Immunology

## 2018-04-30 ENCOUNTER — Ambulatory Visit (INDEPENDENT_AMBULATORY_CARE_PROVIDER_SITE_OTHER): Payer: BLUE CROSS/BLUE SHIELD | Admitting: Allergy and Immunology

## 2018-04-30 VITALS — BP 112/80 | HR 76 | Temp 99.0°F | Resp 16 | Ht 66.5 in | Wt 153.6 lb

## 2018-04-30 DIAGNOSIS — K219 Gastro-esophageal reflux disease without esophagitis: Secondary | ICD-10-CM

## 2018-04-30 DIAGNOSIS — J4530 Mild persistent asthma, uncomplicated: Secondary | ICD-10-CM

## 2018-04-30 DIAGNOSIS — J383 Other diseases of vocal cords: Secondary | ICD-10-CM | POA: Diagnosis not present

## 2018-04-30 DIAGNOSIS — J3089 Other allergic rhinitis: Secondary | ICD-10-CM | POA: Diagnosis not present

## 2018-04-30 MED ORDER — FAMOTIDINE 40 MG PO TABS: 40.0000 mg | ORAL_TABLET | Freq: Every day | ORAL | 5 refills | Status: DC

## 2018-04-30 MED ORDER — OMEPRAZOLE 40 MG PO CPDR: 40.0000 mg | DELAYED_RELEASE_CAPSULE | ORAL | 5 refills | Status: DC

## 2018-04-30 NOTE — Progress Notes (Signed)
Angela Duarte,  Thank you for referring Angela Duarte to the [facility] [place] on 04/30/2018.   Below is a summation of this patient's evaluation and recommendations.  Thank you for your referral. I will keep you informed about this patient's response to treatment.   If you have any questions please do not hesitate to contact me.   Sincerely,  Angela Priest, MD Allergy / Immunology Angela Duarte   ______________________________________________________________________    NEW PATIENT NOTE  Referring Provider: Rhea Duarte* Primary Provider: Gordan Payment., MD Date of office visit: 04/30/2018    Subjective:   Chief Complaint:  Angela Duarte (DOB: 01-24-1961) is a 58 y.o. female who presents to the clinic on 04/30/2018 with a chief complaint of Asthma .     HPI: Angela Duarte presents to this clinic in evaluation of issues with breathing.  Apparently I had seen her in this clinic over 10 years ago for allergic rhinitis which apparently is a minimal issue at this point in time.  For several decade she has had issues with some sneezing and nasal congestion which has actually improved as she has aged and at this point time she rarely uses an antihistamine for these type of issues.  She has no associated anosmia or headache.  What has been a problem since about 2012 is the development of shortness of breath and coughing and wheezing.  Apparently this has been a progressive issue and this winter has been particularly bad.  Usually she has problems from February through April.  She describes shortness of breath and having difficulty breathing air in.  This gets much worse when talking.  She will get an occasional raspy voice and she feels as though there is a coating in her throat and she throat clears.  Interestingly, she can sleep throughout the night without much difficulty.   She can walk on a treadmill without difficulty.  She was given Qvar which she only uses as needed at this point and she rarely uses any pro-air that he she has been given in the past.  There does not really appear to be any obvious provoking factor giving rise to this issue except possibly outdoor exposure.  She did disconnect her gas logs about 4 weeks ago and this may have helped her issue somewhat.  She denies any symptoms consistent with reflux.  She does not have any problem swallowing or choking when drinking or eating.   Past Medical History:  Diagnosis Date  . Asthma   . Family history of breast cancer   . Fibrocystic breast changes   . Urethritis    post coital    Past Surgical History:  Procedure Laterality Date  . APPENDECTOMY  1976  . TUBAL LIGATION Bilateral 1994  . URETER SURGERY  1968   reconstruction  . WISDOM TOOTH EXTRACTION  1981    Allergies as of 04/30/2018   No Known Allergies     Medication List      cetirizine 10 MG tablet Commonly known as:  ZYRTEC Take 10 mg by mouth daily.   PROAIR HFA 108 (90 Base) MCG/ACT inhaler Generic drug:  albuterol Inhale 2 puffs into the lungs every 4 (four) hours as needed for wheezing or shortness of breath.   QVAR REDIHALER 80 MCG/ACT inhaler Generic drug:  beclomethasone       Review of systems negative except as noted in  HPI / PMHx or noted below:  Review of Systems  Constitutional: Negative.   HENT: Negative.   Eyes: Negative.   Respiratory: Negative.   Cardiovascular: Negative.   Gastrointestinal: Negative.   Genitourinary: Negative.   Musculoskeletal: Negative.   Skin: Negative.   Neurological: Negative.   Endo/Heme/Allergies: Negative.   Psychiatric/Behavioral: Negative.     Family History  Problem Relation Age of Onset  . Breast cancer Sister 6746       first occurence age 57, second at age 57  . Pancreatic cancer Mother 3178  . Heart attack Brother   . Breast cancer Maternal Aunt        post  menopausal  . Breast cancer Paternal Aunt        post menoausal  . Breast cancer Maternal Aunt        dx over 3750  . Lung cancer Maternal Aunt   . Bladder Cancer Maternal Aunt   . Breast cancer Cousin        mat first cousin  . Breast cancer Cousin        2 additional mat first cousin  . Lung cancer Paternal Uncle   . Breast cancer Paternal Aunt        dx over 2250  . Breast cancer Cousin        pat cousin with possible breast cancer    Social History   Socioeconomic History  . Marital status: Married    Spouse name: Angela Duarte  . Number of children: 2  . Years of education: Not on file  . Highest education level: Not on file  Occupational History    Employer: Angela Duarte  Social Needs  . Financial resource strain: Not on file  . Food insecurity:    Worry: Not on file    Inability: Not on file  . Transportation needs:    Medical: Not on file    Non-medical: Not on file  Tobacco Use  . Smoking status: Never Smoker  . Smokeless tobacco: Never Used  Substance and Sexual Activity  . Alcohol use: Yes    Comment: Very little  . Drug use: No  . Sexual activity: Yes    Partners: Male    Birth control/protection: Surgical    Comment: BTL  Lifestyle  . Physical activity:    Days per week: Not on file    Minutes per session: Not on file  . Stress: Not on file  Relationships  . Social connections:    Talks on phone: Not on file    Gets together: Not on file    Attends religious service: Not on file    Active member of club or organization: Not on file    Attends meetings of clubs or organizations: Not on file    Relationship status: Not on file  . Intimate partner violence:    Fear of current or ex partner: Not on file    Emotionally abused: Not on file    Physically abused: Not on file    Forced sexual activity: Not on file  Other Topics Concern  . Not on file  Social History Narrative  . Not on file    Environmental and Social history  Lives in a house  with a dry environment, no animals located inside the household, carpet in the bedroom, no plastic on the bed, no plastic on the pillow, no smokers located inside the household.  She works in an office setting.  Objective:   Vitals:  04/30/18 0941  BP: 112/80  Pulse: 76  Resp: 16  Temp: 99 F (37.2 C)  SpO2: 98%   Height: 5' 6.5" (168.9 cm) Weight: 153 lb 9.6 oz (69.7 kg)  Physical Exam Constitutional:      Appearance: She is not diaphoretic.     Comments: Audible inspiratory and expiratory wheezing, throat clearing  HENT:     Head: Normocephalic. No right periorbital erythema or left periorbital erythema.     Right Ear: Ear canal and external ear normal. Tympanic membrane is scarred.     Left Ear: Tympanic membrane, ear canal and external ear normal.     Nose: Nose normal. No mucosal edema or rhinorrhea.     Mouth/Throat:     Pharynx: Uvula midline. No oropharyngeal exudate.  Eyes:     General: Lids are normal.     Conjunctiva/sclera: Conjunctivae normal.     Pupils: Pupils are equal, round, and reactive to light.  Neck:     Thyroid: No thyromegaly.     Trachea: No tracheal tenderness or tracheal deviation.  Cardiovascular:     Rate and Rhythm: Normal rate and regular rhythm.     Heart sounds: Normal heart sounds, S1 normal and S2 normal. No murmur.  Pulmonary:     Effort: Pulmonary effort is normal. No respiratory distress.     Breath sounds: Normal breath sounds. Stridor present. No wheezing or rales.  Chest:     Chest wall: No tenderness.  Abdominal:     General: There is no distension.     Palpations: Abdomen is soft. There is no mass.     Tenderness: There is no abdominal tenderness. There is no guarding or rebound.  Musculoskeletal:        General: No tenderness.  Lymphadenopathy:     Head:     Right side of head: No tonsillar adenopathy.     Left side of head: No tonsillar adenopathy.     Cervical: No cervical adenopathy.  Skin:    Coloration: Skin is  not pale.     Findings: No erythema or rash.     Nails: There is no clubbing.   Neurological:     Mental Status: She is alert.     Diagnostics: Allergy skin tests were performed.  She demonstrated hypersensitivity to [place]  Spirometry was performed and demonstrated an FEV1 of 2.68 @ 91 % of predicted. FEV1/FVC = 0.77.  She had cut off of her expiratory loop.  An inspiratory loop was not performed.  Following administration nebulized albuterol her FEV1 did not improve.   Results of a chest x-ray obtained 13 April 2018 identified the following:  The heart size and mediastinal contours are within normal limits. Both lungs are clear. The visualized skeletal structures are Unremarkable.  Results of blood tests obtained 10 April 2018 identified WBC 4.5, absolute eosinophil 100, absolute lymphocyte 1700, hemoglobin 14.2, platelet 233.  Assessment and Plan:    1. Not well controlled mild persistent asthma   2. Other allergic rhinitis   3. LPRD (laryngopharyngeal reflux disease)   4. Vocal cord dysfunction      1.  Allergen avoidance measures  2.  Treat and prevent inflammation:   A.  Qvar 80 Redihaler -2 inhalations every day  3.  Treat and prevent reflux:   A.  Omeprazole 40 mg -1 tablet in a.m.  B.  Famotidine 40 mg -1 tablet in p.m.  4.  If needed:   A.  Pro-air HFA 2  inhalations every 4-6 hours  B.  Cetirizine 10 mg tablet 1 time per day  5.  Evaluation of throat with ENT  6.  Return to clinic in 3 weeks or earlier if problem  Although [place] may have a component of inflammation affecting her lungs I think the majority of her respiratory tract symptoms are being generated by some issues with her throat.  This may be reflux induced respiratory disease or vocal cord dysfunction or some other abnormality.  We will empirically treat her for reflux as noted above and have her throat evaluated by ENT.  I will see her back in this clinic in 3 weeks or earlier if there  is a problem.  Angela Priest, MD Allergy / Immunology [facility]

## 2018-04-30 NOTE — Patient Instructions (Addendum)
  1.  Allergen avoidance measures  2.  Treat and prevent inflammation:   A.  Qvar 80 Redihaler -2 inhalations every day  3.  Treat and prevent reflux:   A.  Omeprazole 40 mg -1 tablet in a.m.  B.  Famotidine 40 mg -1 tablet in p.m.  4.  If needed:   A.  Pro-air HFA 2 inhalations every 4-6 hours  B.  Cetirizine 10 mg tablet 1 time per day  5.  Evaluation of throat with ENT  6.  Return to clinic in 3 weeks or earlier if problem

## 2018-05-01 ENCOUNTER — Telehealth: Payer: Self-pay

## 2018-05-01 ENCOUNTER — Encounter: Payer: Self-pay | Admitting: Allergy and Immunology

## 2018-05-01 NOTE — Telephone Encounter (Signed)
Called and informed patient that [facility] is [facility] on Monday, February 17th at 4:10 pm, needing to arrive at 3:40 pm to check-in.  Patient ok with plan.  [facility]

## 2018-05-02 ENCOUNTER — Encounter: Payer: BLUE CROSS/BLUE SHIELD | Admitting: Hematology

## 2018-05-15 IMAGING — MG DIGITAL SCREENING BILATERAL MAMMOGRAM WITH CAD
3 series · 3 of 3 positions shown · non-contrast
Comparison: Previous exam(s).

CLINICAL DATA: Screening.

EXAM:
DIGITAL SCREENING BILATERAL MAMMOGRAM WITH CAD

[R CC]
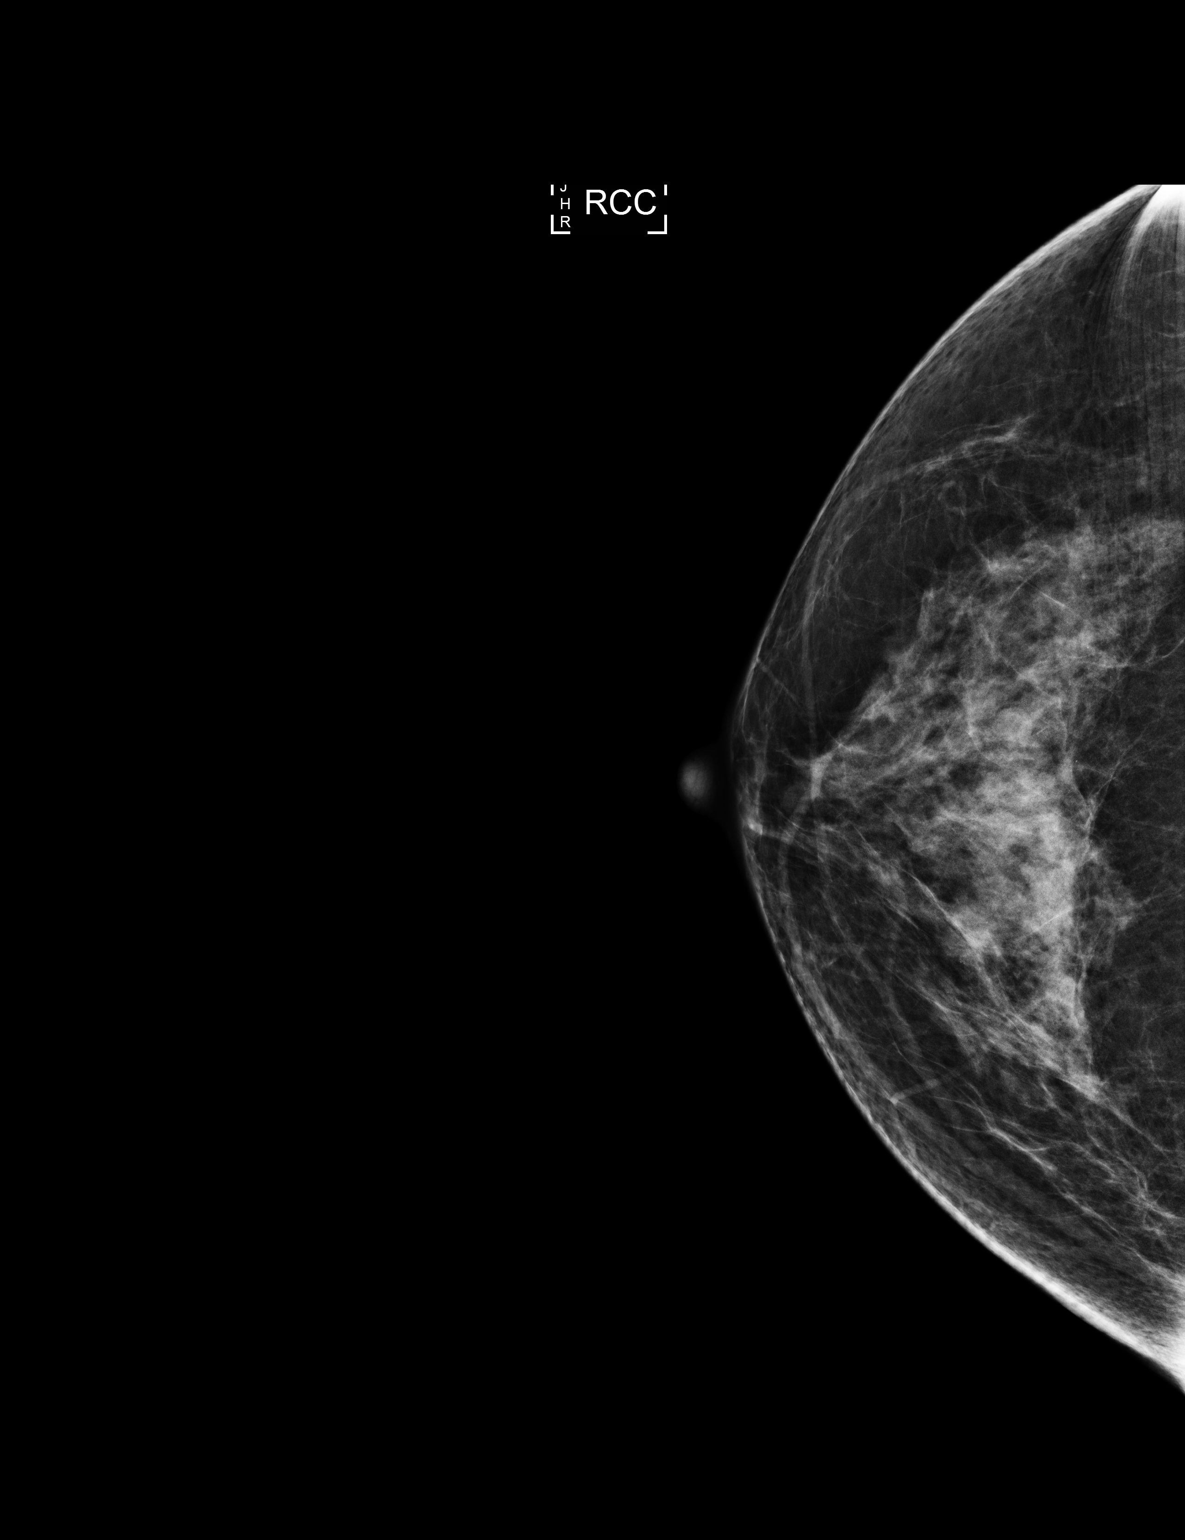

[R MLO]
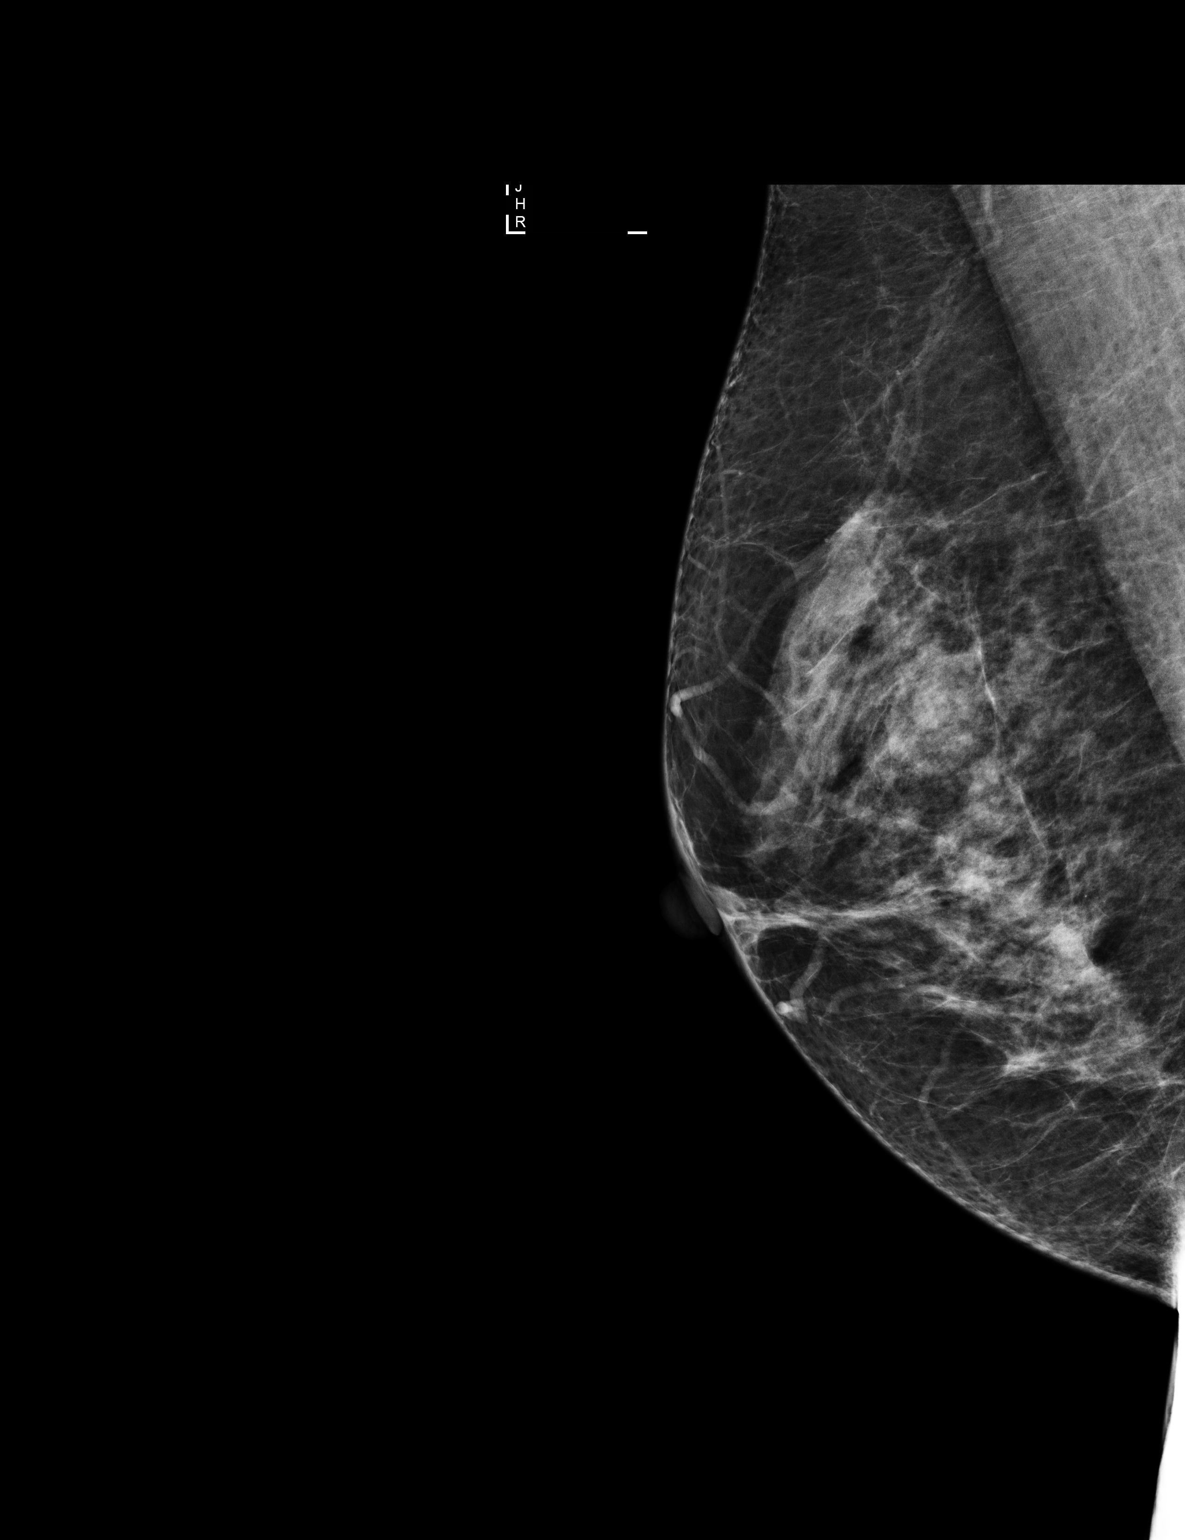

[L MLO]
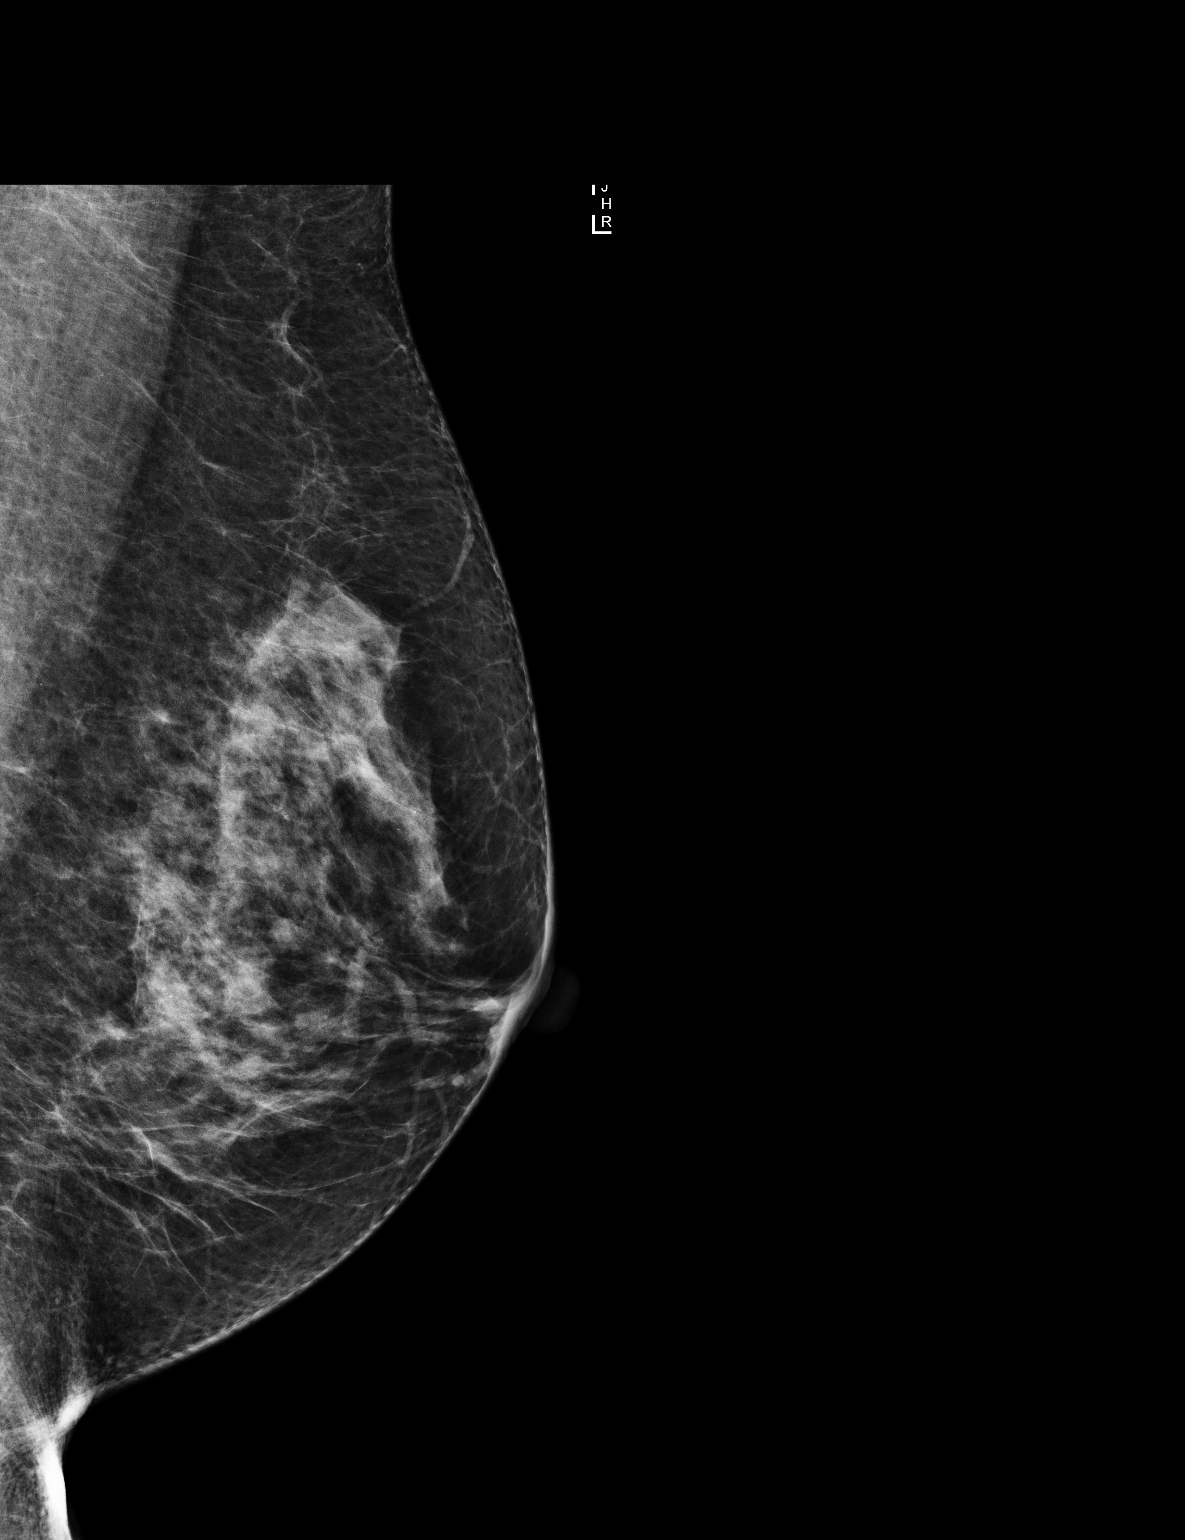

[3 of 3 positions shown; findings below may reference images not displayed]

ACR Breast Density Category c: The breast tissue is heterogeneously
dense, which may obscure small masses.
FINDINGS: There are no findings suspicious for malignancy. Images were
processed with CAD.
IMPRESSION: No mammographic evidence of malignancy. A result letter of this
screening mammogram will be mailed directly to the patient.

RECOMMENDATION:
Screening mammogram in one year. (Code:[id number])

BI-RADS CATEGORY  1: Negative.

## 2018-05-16 ENCOUNTER — Ambulatory Visit: Payer: BLUE CROSS/BLUE SHIELD | Admitting: Allergy and Immunology

## 2018-06-12 ENCOUNTER — Encounter: Payer: BLUE CROSS/BLUE SHIELD | Admitting: Hematology

## 2018-06-12 ENCOUNTER — Other Ambulatory Visit: Payer: Self-pay | Admitting: Specialist

## 2018-06-12 DIAGNOSIS — Z1231 Encounter for screening mammogram for malignant neoplasm of breast: Secondary | ICD-10-CM

## 2018-07-30 ENCOUNTER — Ambulatory Visit: Payer: BLUE CROSS/BLUE SHIELD

## 2018-09-04 ENCOUNTER — Other Ambulatory Visit: Payer: Self-pay

## 2018-09-04 ENCOUNTER — Ambulatory Visit
Admission: RE | Admit: 2018-09-04 | Discharge: 2018-09-04 | Disposition: A | Discharge status: Home / Self Care | Payer: BLUE CROSS/BLUE SHIELD | Source: Ambulatory Visit | Attending: Specialist | Admitting: Specialist

## 2018-09-04 DIAGNOSIS — Z1231 Encounter for screening mammogram for malignant neoplasm of breast: Secondary | ICD-10-CM

## 2018-09-24 ENCOUNTER — Encounter: Payer: Self-pay | Admitting: Allergy and Immunology

## 2018-09-24 ENCOUNTER — Other Ambulatory Visit: Payer: Self-pay

## 2018-09-24 ENCOUNTER — Ambulatory Visit (INDEPENDENT_AMBULATORY_CARE_PROVIDER_SITE_OTHER): Payer: BC Managed Care – PPO | Admitting: Allergy and Immunology

## 2018-09-24 VITALS — BP 100/64 | HR 88 | Temp 98.3°F | Resp 14

## 2018-09-24 DIAGNOSIS — J4530 Mild persistent asthma, uncomplicated: Secondary | ICD-10-CM | POA: Diagnosis not present

## 2018-09-24 DIAGNOSIS — R061 Stridor: Secondary | ICD-10-CM

## 2018-09-24 DIAGNOSIS — K219 Gastro-esophageal reflux disease without esophagitis: Secondary | ICD-10-CM | POA: Diagnosis not present

## 2018-09-24 DIAGNOSIS — J324 Chronic pansinusitis: Secondary | ICD-10-CM

## 2018-09-24 DIAGNOSIS — J3089 Other allergic rhinitis: Secondary | ICD-10-CM | POA: Diagnosis not present

## 2018-09-24 MED ORDER — QVAR REDIHALER 80 MCG/ACT IN AERB: INHALATION_SPRAY | RESPIRATORY_TRACT | 5 refills | Status: AC

## 2018-09-24 NOTE — Patient Instructions (Addendum)
  1.  Treat and prevent inflammation:   A.  Qvar 80 Redihaler -2 inhalations 2 times per day  2.  If needed:   A.  Pro-air HFA 2 inhalations every 4-6 hours  B.  Cetirizine 10 mg tablet 1 time per day  3. CT scan of neck for stridor  4. CT scan of sinuses for chronic sinusitis   5. Full PFT with lung volumes and Inspiratory flow- volume loop  6. Bronchoscopy? [facility]?  7.  Return to clinic in 8 weeks or earlier if problem

## 2018-09-24 NOTE — Progress Notes (Signed)
[facility]   Follow-up Note  Referring Provider: Raina Mina., MD Primary Provider: Raina Mina., MD Date of Office Visit: 09/24/2018  Subjective:   Angela Duarte (DOB: [birth year]) is a 58 y.o. female who returns to the [facility] on 09/24/2018 in re-evaluation of the following:  HPI: Angela Duarte returns to this clinic in reevaluation of breathing problems addressed during her initial evaluation of 30 April 2018.  At that point in time she was having issues with intermittent shortness of breath and coughing and wheezing and recurrent issues with raspy voice and a coating in her throat.  At that point in time we started her on therapy for inflammation of her airway and reflux and had her undergo evaluation with ENT.  She never really completely resolved her issues and continued with recurrent coughing and wheezing especially when she breathes in and feeling that her throat was all messed up.  In conjunction with this issue has some nasal congestion and fullness of her upper airway.  She has had a few courses of antibiotics during this timeframe and became quite sick with lots of coughing and head congestion on the beginning of June requiring the administration of azithromycin and prednisone.  She did visit with ENT who did not document any evidence of LPR and she discontinued her therapy for reflux.  She does not consistently use her Qvar but does occasionally use a short acting bronchodilator which she thinks helps her symptoms.  Allergies as of 09/24/2018   No Known Allergies     Medication List     cetirizine 10 MG tablet Commonly known as: ZYRTEC Take 10 mg by mouth daily.   ProAir HFA 108 (90 Base) MCG/ACT inhaler Generic drug: albuterol Inhale 2 puffs into the lungs every 4 (four) hours as needed for wheezing or shortness of breath.   Qvar RediHaler 80 MCG/ACT inhaler Generic drug: beclomethasone        Past Medical History:  Diagnosis Date  . Asthma   . Family history of breast cancer   . Fibrocystic breast changes   . Urethritis    post coital    Past Surgical History:  Procedure Laterality Date  . APPENDECTOMY  1976  . TUBAL LIGATION Bilateral 1994  . URETER SURGERY  1968   reconstruction  . WISDOM TOOTH EXTRACTION  1981    Review of systems negative except as noted in HPI / PMHx or noted below:  Review of Systems  Constitutional: Negative.   HENT: Negative.   Eyes: Negative.   Respiratory: Negative.   Cardiovascular: Negative.   Gastrointestinal: Negative.   Genitourinary: Negative.   Musculoskeletal: Negative.   Skin: Negative.   Neurological: Negative.   Endo/Heme/Allergies: Negative.   Psychiatric/Behavioral: Negative.      Objective:   Vitals:   09/24/18 1735  BP: 100/64  Pulse: 88  Resp: 14  Temp: 98.3 F (36.8 C)  SpO2: 97%          Physical Exam Constitutional:      Appearance: She is not diaphoretic.     Comments: Inspiratory stridor, transmitted inspiratory wheezes into the chest from laryngeal area  HENT:     Head: Normocephalic.     Right Ear: Tympanic membrane, ear canal and external ear normal.     Left Ear: Tympanic membrane, ear canal and external ear normal.     Nose: Nose normal. No mucosal edema or rhinorrhea.  Mouth/Throat:     Pharynx: Uvula midline. No oropharyngeal exudate.  Eyes:     Conjunctiva/sclera: Conjunctivae normal.  Neck:     Thyroid: No thyromegaly.     Trachea: Trachea normal. No tracheal tenderness or tracheal deviation.  Cardiovascular:     Rate and Rhythm: Normal rate and regular rhythm.     Heart sounds: Normal heart sounds, S1 normal and S2 normal. No murmur.  Pulmonary:     Effort: No respiratory distress.     Breath sounds: Normal breath sounds. No stridor. No wheezing or rales.  Lymphadenopathy:     Head:     Right side of head: No tonsillar adenopathy.     Left side of head: No tonsillar  adenopathy.     Cervical: No cervical adenopathy.  Skin:    Findings: No erythema or rash.     Nails: There is no clubbing.   Neurological:     Mental Status: She is alert.     Diagnostics:    Spirometry was performed and demonstrated an FEV1 of 2.92 at 99 % of predicted.  Results of a chest x-ray obtained 13 April 2018 identified the following:  The heart size and mediastinal contours are within normal limits. Both lungs are clear. The visualized skeletal structures are unremarkable.  Assessment and Plan:   1. Not well controlled mild persistent asthma   2. Other allergic rhinitis   3. LPRD (laryngopharyngeal reflux disease)   4. Chronic pansinusitis   5. Stridor     1.  Treat and prevent inflammation:   A.  Qvar 80 Redihaler -2 inhalations 2 times per day  2.  If needed:   A.  Pro-air HFA 2 inhalations every 4-6 hours  B.  Cetirizine 10 mg tablet 1 time per day  3. CT scan of neck for stridor  4. CT scan of sinuses for chronic sinusitis   5. Full PFT with lung volumes and Inspiratory flow- volume loop  6. Bronch[facility]? [facility]?  7.  Return to clinic in 8 weeks or earlier if problem  It is not entirely clear what is going on with Angela Duarte.  She certainly has an abnormal airway and makes abnormal airway sounds suggestive of stridor.  It is certainly reassuring that her ENT exam via rhinoscopy did not identify any significant abnormality but I wonder if she has some type of subglottic lesion that was not identified with his exam and we will obtain a CT scan of her neck to look for a narrowing in that area.  As well, she still has some persistent upper airway symptoms and will see if her upper airway is inflamed by obtaining a CT scan of her sinuses.  I would like to get an inspiratory flow volume loop and look at her lung volumes to help discern if she has evidence of vocal cord dysfunction or asthma.  I will see her back in this clinic in 8 weeks or  earlier if there is a problem.    Laurette SchimkeEric , MD Allergy / Immunology [facility]

## 2018-09-25 ENCOUNTER — Encounter: Payer: Self-pay | Admitting: Allergy and Immunology

## 2018-09-26 ENCOUNTER — Other Ambulatory Visit: Payer: Self-pay | Admitting: Allergy and Immunology

## 2018-09-26 ENCOUNTER — Telehealth: Payer: Self-pay

## 2018-09-26 DIAGNOSIS — R061 Stridor: Secondary | ICD-10-CM

## 2018-09-26 NOTE — Telephone Encounter (Signed)
Talked with patient and informed her that her Pulmonary Function Study is scheduled at [facility] for Tuesday, June 30th, needing to check-in at 9:30 am. Also informed her that starting 6 hours prior to study she should not use any inhalers, not have any caffeine or chocolate, and not smoke.   Also informed her that the order for her neck CT scan has been sent to [facility] and they should be calling her soon.

## 2018-09-27 ENCOUNTER — Telehealth: Payer: Self-pay

## 2018-09-27 NOTE — Telephone Encounter (Signed)
Called patient and informed her that she is scheduled for Neck CT scan at [facility] ( [address] location), needing to check-in at 12:40pm.  Let her know that she should not eat any food starting four hours prior to appointment, but ok to drink liquid or use medication.

## 2018-10-08 ENCOUNTER — Telehealth: Payer: Self-pay

## 2018-10-08 NOTE — Telephone Encounter (Signed)
Called and informed patient that Pulmonary Function Test was ok per Dr.Kozlow.  Will scan results into Epic system.

## 2018-10-11 ENCOUNTER — Other Ambulatory Visit: Payer: BC Managed Care – PPO

## 2018-10-15 ENCOUNTER — Other Ambulatory Visit: Payer: BC Managed Care – PPO

## 2018-10-25 ENCOUNTER — Ambulatory Visit
Admission: RE | Admit: 2018-10-25 | Discharge: 2018-10-25 | Disposition: A | Discharge status: Home / Self Care | Payer: BC Managed Care – PPO | Source: Ambulatory Visit | Attending: Allergy and Immunology | Admitting: Allergy and Immunology

## 2018-10-25 DIAGNOSIS — R061 Stridor: Secondary | ICD-10-CM

## 2018-10-25 MED ORDER — IOPAMIDOL (ISOVUE-300) INJECTION 61%
75.0000 mL | Freq: Once | INTRAVENOUS | Status: AC | PRN
  Administered 2018-10-25: 15:00:00 75 mL via INTRAVENOUS

## 2018-10-26 ENCOUNTER — Other Ambulatory Visit: Payer: Self-pay | Admitting: *Deleted

## 2018-10-26 DIAGNOSIS — J386 Stenosis of larynx: Secondary | ICD-10-CM

## 2018-10-31 ENCOUNTER — Telehealth: Payer: Self-pay | Admitting: Allergy and Immunology

## 2018-10-31 NOTE — Telephone Encounter (Signed)
Waniya called in and stated she spoke with Dr. Neldon Mc on Friday.  Kirstine states she felt uncomfortable calling Dr. Neldon Mc on his cell phone because she knew he was off and didn't want to bother him.  Artemisia states she had a chance to look at her test results and would like a call back from Dr. Neldon Mc.  Please call [phone].

## 2018-11-07 ENCOUNTER — Encounter: Payer: Self-pay | Admitting: Internal Medicine

## 2018-11-07 ENCOUNTER — Other Ambulatory Visit: Payer: Self-pay

## 2018-11-07 ENCOUNTER — Ambulatory Visit (INDEPENDENT_AMBULATORY_CARE_PROVIDER_SITE_OTHER): Payer: BC Managed Care – PPO | Admitting: Internal Medicine

## 2018-11-07 VITALS — BP 114/62 | HR 68 | Temp 98.1°F | Ht 67.25 in | Wt 156.6 lb

## 2018-11-07 DIAGNOSIS — J386 Stenosis of larynx: Secondary | ICD-10-CM | POA: Insufficient documentation

## 2018-11-07 DIAGNOSIS — J4530 Mild persistent asthma, uncomplicated: Secondary | ICD-10-CM | POA: Diagnosis not present

## 2018-11-07 NOTE — Patient Instructions (Signed)
Continue prednisone but try to taper to 5 mg daily   We will refer you to ENT ASAP   Pulmonary follow up is as needed

## 2018-11-07 NOTE — Assessment & Plan Note (Signed)
Onset around the age of 43 with no antecedent intubation / trauma  - CT neck 10/25/18 Subglottic stenosis. Associated soft tissue thickening without definite mass lesion. Direct mucosal evaluation recommended to rule out neoplasm however there is no definite imaging evidence of tumor. - PFTs 10/02/18 wnl x for abn squared off  f/v loop with insp > exp flow limitation c/w variable extrathoracic airflow obst  - refer to ENT 11/07/2018   Exam and pfts f/v and ct all pooint to significant subglottic stenosis (preserved cord function) of ? Etiology as no h/o tracheal trauma or antecedent intubation issues > ent eval next ? Candidate for laser resection?

## 2018-11-07 NOTE — Progress Notes (Signed)
Angela Desanctisynthia F Duarte, female    DOB: 05/06/1960,   MRN: [id number]   Brief patient profile:  [age] yowf never smoker with elementary school rhinitis = runny nose, itchy/sneezing/puffy eyes  on exp to strong smells like perfume and did fine with avoidance and rx benadryl clariton seemed to help then around 2010 had to use epipen > Kozlow eval neg x for tree pollen rec qvar which helped some but still sev times a year March/ April with cough and wheezing that did not respond to albuterol > neb in doctor's office and pred worked but summer time 2019  Started to try to get back in shape (last time 2015 around 16 lb less)  and noted poor ex tol to doe never improved > [place] PFT June 30       History of Present Illness  11/07/2018  Pulmonary/ 1st office eval/[facility]  Chief Complaint  Patient presents with  . Follow-up    PFT's done today. Pt c/o wheezing for the past year.   Dyspnea:  Ok maybe 20 min in mall nl pace  Cough: sporadic dry  Sleep: fine now p prednisone 10 mg x last 2 weeks  SABA use: not for at the last 3 days prior to OV  And baselline most every uses is 2 puff/day  No obvious day to day or daytime variability or assoc excess/ purulent sputum or mucus plugs or hemoptysis or cp or chest tightness, subjective wheeze or overt sinus or hb symptoms.   Sleeping now without nocturnal  or early am exacerbation  of respiratory  c/o's or need for noct saba. Also denies any obvious fluctuation of symptoms with weather or environmental changes or other aggravating or alleviating factors except as outlined above   No unusual exposure hx or h/o childhood pna/ asthma or knowledge of premature birth.  Current Allergies, Complete Past Medical History, Past Surgical History, Family History, and Social History were reviewed in [facility] electronic medical record.  ROS  The following are not active complaints unless bolded Hoarseness, sore throat, dysphagia, dental problems, itching, sneezing,   nasal congestion or discharge of excess mucus or purulent secretions, ear ache,   fever, chills, sweats, unintended wt loss or wt gain, classically pleuritic or exertional cp,  orthopnea pnd or arm/hand swelling  or leg swelling, presyncope, palpitations, abdominal pain, anorexia, nausea, vomiting, diarrhea  or change in bowel habits or change in bladder habits, change in stools or change in urine, dysuria, hematuria,  rash, arthralgias, visual complaints, headache, numbness, weakness or ataxia or problems with walking or coordination,  change in mood or  memory.           Past Medical History:  Diagnosis Date  . Asthma   . Family history of breast cancer   . Fibrocystic breast changes   . Urethritis    post coital    Outpatient Medications Prior to Visit  Medication Sig Dispense Refill  . cetirizine (ZYRTEC) 10 MG tablet Take 10 mg by mouth daily.    . predniSONE (DELTASONE) 10 MG tablet Take 10 mg by mouth daily with breakfast.    . PROAIR HFA 108 (90 Base) MCG/ACT inhaler Inhale 2 puffs into the lungs every 4 (four) hours as needed for wheezing or shortness of breath.    Marland Kitchen. QVAR REDIHALER 80 MCG/ACT inhaler Inhale two doses twice daily to prevent cough or wheeze.  Rinse, gargle, and spit after use. 11 g 5      Objective:  BP 114/62 (BP Location: Left Arm, Cuff Size: Normal)   Pulse 68   Temp 98.1 F (36.7 C) (Oral)   Ht 5' 7.25" (1.708 m)   Wt 156 lb 9.6 oz (71 kg)   LMP 05/05/2013 (Approximate)   SpO2 99% Comment: on RA  BMI 24.35 kg/m   SpO2: 99 %(on RA)    amb wf good phonation/ classic pseudowheeze mostly on insp    HEENT: nl dentition, turbinates bilaterally, and oropharynx. Nl external ear canals without cough reflex   NECK :  without JVD/Nodes/TM/ nl carotid upstrokes bilaterally   LUNGS: no acc muscle use,  Nl contour chest which is clear to A and P bilaterally without cough on insp or exp maneuvers   CV:  RRR  no s3 or murmur or increase in P2, and no  edema   ABD:  soft and nontender with nl inspiratory excursion in the supine position. No bruits or organomegaly appreciated, bowel sounds nl  MS:  Nl gait/ ext warm without deformities, calf tenderness, cyanosis or clubbing No obvious joint restrictions   SKIN: warm and dry without lesions    NEURO:  alert, approp, nl sensorium with  no motor or cerebellar deficits apparent.          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 Associated soft tissue thickening without definite mass lesion. Direct mucosal evaluation recommended to rule out neoplasm however there is no definite imaging evidence of tumor. - PFTs 10/02/18 wnl x for abn squared off  f/v loop with insp > exp flow limitation c/w variable extrathoracic airflow obst  - refer to ENT 11/07/2018   Exam and pfts f/v and ct all pooint to significant subglottic stenosis (preserved cord function) of ? Etiology as no h/o tracheal trauma or antecedent intubation issues > ent eval next ? Candidate for laser resection?      Mild persistent asthma Spirometry nl 10/02/18  I suspect once the subglottic stenosis is resolved her asthma will be easy to control again with just ICS but time will tell.  In meantime reviewed: If your breathing worsens or you need to use your rescue inhaler more than twice weekly or wake up more than twice a month with any respiratory symptoms or require more than two rescue inhalers per year, we need to see you right away because this means we're not controlling the underlying problem (inflammation) adequately.  Rescue inhalers (albuterol) do not control inflammation and overuse can lead to unnecessary and costly consequences.  They can make you feel better temporarily but eventually they will quit working effectively much as sleep aids lead to more insomnia if used regularly.    To keep saba use  low and symptoms better agreed to short term prednisone with a ceiling of 10 mg and a floor of 5 mg.     Total time devoted to counseling  > 50 % of initial 60 min office visit:  review case with pt/ discussion of options/alternatives/ personally creating written customized instructions  in presence of pt  then going over those specific  Instructions directly with the pt including how to use all of the meds but in particular covering each new medication in detail and the difference between the maintenance= "automatic" meds and the prns using an action plan format for the latter (If this problem/symptom => do that organization reading Left to right).  Please see AVS from this visit for a full list of these instructions which I personally wrote for this pt and  are unique to this visit.    Christinia Gully, MD 11/07/2018

## 2018-11-07 NOTE — Assessment & Plan Note (Signed)
Spirometry nl 10/02/18  I suspect once the subglottic stenosis is resolved her asthma will be easy to control again with just ICS but time will tell.  In meantime reviewed: If your breathing worsens or you need to use your rescue inhaler more than twice weekly or wake up more than twice a month with any respiratory symptoms or require more than two rescue inhalers per year, we need to see you right away because this means we're not controlling the underlying problem (inflammation) adequately.  Rescue inhalers (albuterol) do not control inflammation and overuse can lead to unnecessary and costly consequences.  They can make you feel better temporarily but eventually they will quit working effectively much as sleep aids lead to more insomnia if used regularly.    To keep saba use low and symptoms better agreed to short term prednisone with a ceiling of 10 mg and a floor of 5 mg.   Total time devoted to counseling  > 50 % of initial 60 min office visit:  review case with pt/ discussion of options/alternatives/ personally creating written customized instructions  in presence of pt  then going over those specific  Instructions directly with the pt including how to use all of the meds but in particular covering each new medication in detail and the difference between the maintenance= "automatic" meds and the prns using an action plan format for the latter (If this problem/symptom => do that organization reading Left to right).  Please see AVS from this visit for a full list of these instructions which I personally wrote for this pt and  are unique to this visit.

## 2018-12-04 ENCOUNTER — Other Ambulatory Visit (HOSPITAL_COMMUNITY): Payer: BC Managed Care – PPO

## 2018-12-17 NOTE — Pre-Procedure Instructions (Signed)
[id number] - [place], [place] - [address] [facility] [address] [place] [id number] Phone: [phone] Fax: [phone]      Your procedure is scheduled on  12-21-18 Friday from 0730-0820 AM  [facility] "A" at 0530 A.M., and check in at the Admitting office.  Call this number if you have problems the morning of surgery:  [phone]  Call [phone] if you have any questions prior to your surgery date Monday-Friday 8am-4pm    Remember:  Do not eat or drink after midnight the night before your surgery   Take these medicines the morning of surgery with A SIP OF WATER : Cetrizine (Zyrtec) Omeprazole (Prilosec) Prednisone (Deltasone) as needed PROAIR HFA as needed.  QVAR Redihaler as needed  7 days prior to surgery STOP taking any Aspirin (unless otherwise instructed by your surgeon), Aleve, Naproxen, Ibuprofen, Motrin, Advil, Goody's, BC's, all herbal medications, fish oil, and all vitamins.  Bring your inhaler with you.   The Morning of Surgery  Do not wear jewelry, make-up or nail polish.  Do not wear lotions, powders, or perfumes, or deodorant  Do not shave 48 hours prior to surgery.    Do not bring valuables to the hospital.  [facility] is not responsible for any belongings or valuables.  If you are a smoker, DO NOT Smoke 24 hours prior to surgery IF you wear a CPAP at night please bring your mask, tubing, and machine the morning of surgery   Remember that you must have someone to transport you home after your surgery, and remain with you for 24 hours if you are discharged the same day.   Contacts, glasses, hearing aids, dentures or bridgework may not be worn into surgery.    Leave your suitcase in the car.  After surgery it may be brought to your room.  For patients admitted to the hospital, discharge time will be determined by your treatment team.  Patients discharged the day of surgery will not be  allowed to drive home.    Special instructions:   [place]- Preparing For Surgery  Before surgery, you can play an important role. Because skin is not sterile, your skin needs to be as free of germs as possible. You can reduce the number of germs on your skin by washing with CHG (chlorahexidine gluconate) Soap before surgery.  CHG is an antiseptic cleaner which kills germs and bonds with the skin to continue killing germs even after washing.    Oral Hygiene is also important to reduce your risk of infection.  Remember - BRUSH YOUR TEETH THE MORNING OF SURGERY WITH YOUR REGULAR TOOTHPASTE  Please do not use if you have an allergy to CHG or antibacterial soaps. If your skin becomes reddened/irritated stop using the CHG.  Do not shave (including legs and underarms) for at least 48 hours prior to first CHG shower. It is OK to shave your face.  Please follow these instructions carefully.   1. Shower the NIGHT BEFORE SURGERY and the MORNING OF SURGERY with CHG Soap.   2. If you chose to wash your hair, wash your hair first as usual with your normal shampoo.  3. After you shampoo, rinse your hair and body thoroughly to remove the shampoo.  4. Use CHG as you would any other liquid soap. You can apply CHG directly to the skin and wash gently with a scrungie or a clean washcloth.   5.  Apply the CHG Soap to your body ONLY FROM THE NECK DOWN.  Do not use on open wounds or open sores. Avoid contact with your eyes, ears, mouth and genitals (private parts). Wash Face and genitals (private parts)  with your normal soap.   6. Wash thoroughly, paying special attention to the area where your surgery will be performed.  7. Thoroughly rinse your body with warm water from the neck down.  8. DO NOT shower/wash with your normal soap after using and rinsing off the CHG Soap.  9. Pat yourself dry with a CLEAN TOWEL.  10. Wear CLEAN PAJAMAS to bed the night before surgery, wear comfortable clothes the  morning of surgery  11. Place CLEAN SHEETS on your bed the night of your first shower and DO NOT SLEEP WITH PETS.  Day of Surgery:  Do not apply any deodorants/lotions. Please shower the morning of surgery with the CHG soap  Please wear clean clothes to the hospital/surgery center.   Remember to brush your teeth WITH YOUR REGULAR TOOTHPASTE.   Please read over the fact sheets that you were given.

## 2018-12-18 ENCOUNTER — Other Ambulatory Visit (HOSPITAL_COMMUNITY)
Admission: RE | Admit: 2018-12-18 | Discharge: 2018-12-18 | Disposition: A | Discharge status: Home / Self Care | Payer: BC Managed Care – PPO | Source: Ambulatory Visit | Attending: Otolaryngology | Admitting: Otolaryngology

## 2018-12-18 ENCOUNTER — Encounter (HOSPITAL_COMMUNITY)
Admission: RE | Admit: 2018-12-18 | Discharge: 2018-12-18 | Disposition: A | Discharge status: Home / Self Care | Payer: BC Managed Care – PPO | Source: Ambulatory Visit | Attending: Otolaryngology | Admitting: Otolaryngology

## 2018-12-18 ENCOUNTER — Other Ambulatory Visit: Payer: Self-pay

## 2018-12-18 ENCOUNTER — Encounter (HOSPITAL_COMMUNITY): Payer: Self-pay

## 2018-12-18 DIAGNOSIS — Z01812 Encounter for preprocedural laboratory examination: Secondary | ICD-10-CM | POA: Diagnosis present

## 2018-12-18 HISTORY — DX: Stenosis of larynx: J38.6

## 2018-12-18 HISTORY — DX: Gastro-esophageal reflux disease without esophagitis: K21.9

## 2018-12-18 LAB — CBC
HCT: 40.1 % (ref 36.0–46.0)
Hemoglobin: 14.5 g/dL (ref 12.0–15.0)
MCH: 34.5 pg — ABNORMAL HIGH (ref 26.0–34.0)
MCHC: 36.2 g/dL — ABNORMAL HIGH (ref 30.0–36.0)
MCV: 95.5 fL (ref 80.0–100.0)
Platelets: 206 10*3/uL (ref 150–400)
RBC: 4.2 MIL/uL (ref 3.87–5.11)
RDW: 11.6 % (ref 11.5–15.5)
WBC: 4.6 10*3/uL (ref 4.0–10.5)
nRBC: 0 % (ref 0.0–0.2)

## 2018-12-18 NOTE — Progress Notes (Addendum)
PCP - Gilford Rile, MD Cardiologist - pt denies  Chest x-ray - 04/13/2018 -results under Care Everywhere EKG - pt denies-n/a  Stress Test - pt denies ECHO - pt denies  Cardiac Cath - pt denies  Sleep Study - pt denies CPAP - n/a  Fasting Blood Sugar - n/a Checks Blood Sugar _____ times a day-n/a  Blood Thinner Instructions: n/a Aspirin Instructions: n/a  Anesthesia review: pending   Patient denies shortness of breath, fever, cough and chest pain at PAT appointment  Patient verbalized understanding of instructions that were given to them at the PAT appointment. Patient was also instructed that they will need to review over the PAT instructions again at home before surgery.   Coronavirus Screening  Have you experienced the following symptoms:  Cough yes/no: No Fever (>100.69F)  yes/no: No Runny nose yes/no: No Sore throat yes/no: No Difficulty breathing/shortness of breath  yes/no: No  Have you or a family member traveled in the last 14 days and where? yes/no: No   If the patient indicates "YES" to the above questions, their PAT will be rescheduled to limit the exposure to others and, the surgeon will be notified. THE PATIENT WILL NEED TO BE ASYMPTOMATIC FOR 14 DAYS.   If the patient is not experiencing any of these symptoms, the PAT nurse will instruct them to NOT bring anyone with them to their appointment since they may have these symptoms or traveled as well.   Please remind your patients and families that hospital visitation restrictions are in effect and the importance of the restrictions.

## 2018-12-19 LAB — NOVEL CORONAVIRUS, NAA (HOSP ORDER, SEND-OUT TO REF LAB; TAT 18-24 HRS): SARS-CoV-2, NAA: NOT DETECTED

## 2018-12-20 NOTE — Anesthesia Preprocedure Evaluation (Addendum)
Anesthesia Evaluation  Patient identified by MRN, date of birth, ID band Patient awake    Reviewed: Allergy & Precautions, NPO status , Patient's Chart, lab work & pertinent test results  History of Anesthesia Complications Negative for: history of anesthetic complications  Airway Mallampati: II  TM Distance: >3 FB Neck ROM: Full    Dental  (+) Dental Advisory Given, Teeth Intact   Pulmonary asthma ,    Pulmonary exam normal        Cardiovascular negative cardio ROS Normal cardiovascular exam     Neuro/Psych negative neurological ROS  negative psych ROS   GI/Hepatic Neg liver ROS, GERD  Controlled and Medicated,  Endo/Other  negative endocrine ROS  Renal/GU negative Renal ROS     Musculoskeletal  (+) Arthritis ,   Abdominal   Peds  Hematology negative hematology ROS (+)   Anesthesia Other Findings Subglottic stenosis   Reproductive/Obstetrics  S/p tubal ligation                             Anesthesia Physical Anesthesia Plan  ASA: II  Anesthesia Plan: General   Post-op Pain Management:    Induction: Intravenous  PONV Risk Score and Plan: 3 and Treatment may vary due to age or medical condition, Ondansetron and Midazolam  Airway Management Planned: Oral ETT and Natural Airway  Additional Equipment: None  Intra-op Plan:   Post-operative Plan: Extubation in OR  Informed Consent: I have reviewed the patients History and Physical, chart, labs and discussed the procedure including the risks, benefits and alternatives for the proposed anesthesia with the patient or authorized representative who has indicated his/her understanding and acceptance.     Dental advisory given  Plan Discussed with: CRNA and Anesthesiologist  Anesthesia Plan Comments: (Small ETT vs. Intermittent mask ventilation per surgeon preference )       Anesthesia Quick Evaluation

## 2018-12-21 ENCOUNTER — Encounter (HOSPITAL_COMMUNITY): Payer: Self-pay

## 2018-12-21 ENCOUNTER — Encounter (HOSPITAL_COMMUNITY)
Admission: RE | Disposition: A | Discharge status: Home / Self Care | Payer: Self-pay | Source: Home / Self Care | Attending: Otolaryngology

## 2018-12-21 ENCOUNTER — Ambulatory Visit (HOSPITAL_COMMUNITY)
Admission: RE | Admit: 2018-12-21 | Discharge: 2018-12-21 | Disposition: A | Discharge status: Home / Self Care | Payer: BC Managed Care – PPO | Attending: Otolaryngology | Admitting: Otolaryngology

## 2018-12-21 ENCOUNTER — Ambulatory Visit (HOSPITAL_COMMUNITY): Payer: BC Managed Care – PPO

## 2018-12-21 ENCOUNTER — Other Ambulatory Visit: Payer: Self-pay

## 2018-12-21 DIAGNOSIS — Z801 Family history of malignant neoplasm of trachea, bronchus and lung: Secondary | ICD-10-CM | POA: Diagnosis not present

## 2018-12-21 DIAGNOSIS — Z7951 Long term (current) use of inhaled steroids: Secondary | ICD-10-CM | POA: Insufficient documentation

## 2018-12-21 DIAGNOSIS — Z803 Family history of malignant neoplasm of breast: Secondary | ICD-10-CM | POA: Diagnosis not present

## 2018-12-21 DIAGNOSIS — R0602 Shortness of breath: Secondary | ICD-10-CM | POA: Diagnosis not present

## 2018-12-21 DIAGNOSIS — J386 Stenosis of larynx: Secondary | ICD-10-CM | POA: Insufficient documentation

## 2018-12-21 DIAGNOSIS — Z79899 Other long term (current) drug therapy: Secondary | ICD-10-CM | POA: Diagnosis not present

## 2018-12-21 DIAGNOSIS — K219 Gastro-esophageal reflux disease without esophagitis: Secondary | ICD-10-CM | POA: Diagnosis not present

## 2018-12-21 DIAGNOSIS — J45909 Unspecified asthma, uncomplicated: Secondary | ICD-10-CM | POA: Insufficient documentation

## 2018-12-21 DIAGNOSIS — Z8249 Family history of ischemic heart disease and other diseases of the circulatory system: Secondary | ICD-10-CM | POA: Insufficient documentation

## 2018-12-21 DIAGNOSIS — Z800 Family history of malignant neoplasm of digestive organs: Secondary | ICD-10-CM | POA: Insufficient documentation

## 2018-12-21 DIAGNOSIS — M1990 Unspecified osteoarthritis, unspecified site: Secondary | ICD-10-CM | POA: Insufficient documentation

## 2018-12-21 DIAGNOSIS — Z8052 Family history of malignant neoplasm of bladder: Secondary | ICD-10-CM | POA: Diagnosis not present

## 2018-12-21 HISTORY — PX: LARYNGOSCOPY: SHX5203

## 2018-12-21 HISTORY — PX: MITOMYCIN C APPLICATION: SHX6375

## 2018-12-21 HISTORY — PX: CO2 LASER APPLICATION: SHX5778

## 2018-12-21 HISTORY — PX: BALLOON DILATION: SHX5330

## 2018-12-21 SURGERY — LARYNGOSCOPY
Anesthesia: General | Site: Throat

## 2018-12-21 MED ORDER — OXYMETAZOLINE HCL 0.05 % NA SOLN
NASAL | Status: AC
  Filled 2018-12-21: qty 30

## 2018-12-21 MED ORDER — MITOMYCIN 0.2 MG OP KIT
PACK | OPHTHALMIC | Status: DC | PRN
  Administered 2018-12-21: 0.2 mg

## 2018-12-21 MED ORDER — FENTANYL CITRATE (PF) 250 MCG/5ML IJ SOLN
INTRAMUSCULAR | Status: DC | PRN
  Administered 2018-12-21: 25 ug via INTRAVENOUS
  Administered 2018-12-21: 50 ug via INTRAVENOUS

## 2018-12-21 MED ORDER — DEXAMETHASONE SODIUM PHOSPHATE 10 MG/ML IJ SOLN
INTRAMUSCULAR | Status: DC | PRN
  Administered 2018-12-21: 10 mg via INTRAVENOUS

## 2018-12-21 MED ORDER — ONDANSETRON HCL 4 MG/2ML IJ SOLN
INTRAMUSCULAR | Status: DC | PRN
  Administered 2018-12-21: 4 mg via INTRAVENOUS

## 2018-12-21 MED ORDER — ESMOLOL HCL 100 MG/10ML IV SOLN
INTRAVENOUS | Status: DC | PRN
  Administered 2018-12-21: 20 mg via INTRAVENOUS
  Administered 2018-12-21: 30 mg via INTRAVENOUS
  Administered 2018-12-21: 20 mg via INTRAVENOUS

## 2018-12-21 MED ORDER — SUGAMMADEX SODIUM 200 MG/2ML IV SOLN
INTRAVENOUS | Status: DC | PRN
  Administered 2018-12-21: 200 mg via INTRAVENOUS

## 2018-12-21 MED ORDER — ROCURONIUM BROMIDE 10 MG/ML (PF) SYRINGE
PREFILLED_SYRINGE | INTRAVENOUS | Status: DC | PRN
  Administered 2018-12-21: 40 mg via INTRAVENOUS

## 2018-12-21 MED ORDER — PROPOFOL 10 MG/ML IV BOLUS
INTRAVENOUS | Status: DC | PRN
  Administered 2018-12-21: 160 mg via INTRAVENOUS

## 2018-12-21 MED ORDER — MIDAZOLAM HCL 2 MG/2ML IJ SOLN
INTRAMUSCULAR | Status: AC
  Filled 2018-12-21: qty 2

## 2018-12-21 MED ORDER — EPINEPHRINE HCL (NASAL) 0.1 % NA SOLN
NASAL | Status: AC
  Filled 2018-12-21: qty 30

## 2018-12-21 MED ORDER — MIDAZOLAM HCL 5 MG/5ML IJ SOLN
INTRAMUSCULAR | Status: DC | PRN
  Administered 2018-12-21: 1 mg via INTRAVENOUS

## 2018-12-21 MED ORDER — PROPOFOL 10 MG/ML IV BOLUS
INTRAVENOUS | Status: AC
  Filled 2018-12-21: qty 20

## 2018-12-21 MED ORDER — PROPOFOL 500 MG/50ML IV EMUL
INTRAVENOUS | Status: DC | PRN
  Administered 2018-12-21: 150 ug/kg/min via INTRAVENOUS

## 2018-12-21 MED ORDER — FENTANYL CITRATE (PF) 250 MCG/5ML IJ SOLN
INTRAMUSCULAR | Status: AC
  Filled 2018-12-21: qty 5

## 2018-12-21 MED ORDER — 0.9 % SODIUM CHLORIDE (POUR BTL) OPTIME
TOPICAL | Status: DC | PRN
  Administered 2018-12-21: 1000 mL

## 2018-12-21 MED ORDER — EPINEPHRINE HCL (NASAL) 0.1 % NA SOLN
NASAL | Status: DC | PRN
  Administered 2018-12-21: 1 [drp] via TOPICAL

## 2018-12-21 MED ORDER — LACTATED RINGERS IV SOLN
INTRAVENOUS | Status: DC | PRN
  Administered 2018-12-21: 08:00:00 via INTRAVENOUS

## 2018-12-21 MED ORDER — LIDOCAINE 2% (20 MG/ML) 5 ML SYRINGE
INTRAMUSCULAR | Status: DC | PRN
  Administered 2018-12-21: 60 mg via INTRAVENOUS

## 2018-12-21 SURGICAL SUPPLY — 34 items
BALLN PULM 15 16.5 18 X 75CM (BALLOONS) ×1
BALLN PULM 15 16.5 18X75 (BALLOONS) ×3
BALLOON PULM 15 16.5 18X75 (BALLOONS) IMPLANT
BNDG EYE OVAL (GAUZE/BANDAGES/DRESSINGS) ×8 IMPLANT
CANISTER SUCT 3000ML PPV (MISCELLANEOUS) ×4 IMPLANT
COVER BACK TABLE 60X90IN (DRAPES) ×4 IMPLANT
COVER MAYO STAND STRL (DRAPES) ×4 IMPLANT
COVER WAND RF STERILE (DRAPES) ×2 IMPLANT
DRAPE HALF SHEET 40X57 (DRAPES) ×4 IMPLANT
GAUZE 4X4 16PLY RFD (DISPOSABLE) ×4 IMPLANT
GAUZE SPONGE 4X4 12PLY STRL (GAUZE/BANDAGES/DRESSINGS) IMPLANT
GLOVE BIO SURGEON STRL SZ7.5 (GLOVE) ×4 IMPLANT
GOWN STRL REUS W/ TWL LRG LVL3 (GOWN DISPOSABLE) IMPLANT
GOWN STRL REUS W/TWL LRG LVL3 (GOWN DISPOSABLE) ×4
GUARD TEETH (MISCELLANEOUS) IMPLANT
KIT BASIN OR (CUSTOM PROCEDURE TRAY) ×4 IMPLANT
KIT TURNOVER KIT B (KITS) ×4 IMPLANT
NDL HYPO 25GX1X1/2 BEV (NEEDLE) IMPLANT
NDL TRANS ORAL INJECTION (NEEDLE) ×2 IMPLANT
NEEDLE HYPO 25GX1X1/2 BEV (NEEDLE) ×4 IMPLANT
NEEDLE TRANS ORAL INJECTION (NEEDLE) ×4 IMPLANT
NS IRRIG 1000ML POUR BTL (IV SOLUTION) ×4 IMPLANT
PAD ARMBOARD 7.5X6 YLW CONV (MISCELLANEOUS) ×8 IMPLANT
PATTIES SURGICAL .5 X1 (DISPOSABLE) ×2 IMPLANT
PATTIES SURGICAL .5 X3 (DISPOSABLE) IMPLANT
POSITIONER HEAD DONUT 9IN (MISCELLANEOUS) IMPLANT
SOL ANTI FOG 6CC (MISCELLANEOUS) IMPLANT
SOLUTION ANTI FOG 6CC (MISCELLANEOUS)
SUT SILK 2 0 SH (SUTURE) IMPLANT
SYR GAUGE ALLIANCE SINGLE USE (MISCELLANEOUS) ×2 IMPLANT
SYR INFLATE BILIARY GAUGE (MISCELLANEOUS) ×4 IMPLANT
TOWEL GREEN STERILE FF (TOWEL DISPOSABLE) ×4 IMPLANT
TUBE CONNECTING 12'X1/4 (SUCTIONS) ×1
TUBE CONNECTING 12X1/4 (SUCTIONS) ×3 IMPLANT

## 2018-12-21 NOTE — H&P (Signed)
Angela DesanctisCynthia F Duarte is an 58 y.o. female.   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  She presents for dilation.  Past Medical History:  Diagnosis Date  . Asthma   . Family history of breast cancer   . Fibrocystic breast changes   . GERD (gastroesophageal reflux disease)   . Subglottic stenosis not due to surgery   . Urethritis    post coital    Past Surgical History:  Procedure Laterality Date  . APPENDECTOMY  1976  . TUBAL LIGATION Bilateral 1994  . URETER SURGERY  1968   reconstruction  . WISDOM TOOTH EXTRACTION  1981    Family History  Problem Relation Age of Onset  . Breast cancer Sister 1646       first occurence age 57, second at age 57  . Pancreatic cancer Mother 2278  . Heart attack Brother   . Breast cancer Maternal Aunt        post menopausal  . Breast cancer Paternal Aunt        post menoausal  . Breast cancer Maternal Aunt        dx over 4250  . Lung cancer Maternal Aunt   . Bladder Cancer Maternal Aunt   . Breast cancer Cousin        mat first cousin  . Breast cancer Cousin        2 additional mat first cousin  . Lung cancer Paternal Uncle   . Breast cancer Paternal Aunt        dx over 4450  . Breast cancer Cousin        pat cousin with possible breast cancer   Social History:  reports that she has never smoked. She has never used smokeless tobacco. She reports current alcohol use. She reports that she does not use drugs.  Allergies: No Known Allergies  Medications Prior to Admission  Medication Sig Dispense Refill  . cetirizine (ZYRTEC) 10 MG tablet Take 10 mg by mouth every other day.     Marland Kitchen. omeprazole (PRILOSEC) 40 MG capsule Take 40 mg by mouth 2 (two) times daily.    . predniSONE (DELTASONE) 10 MG tablet Take 5 mg by mouth daily as needed (swelling in throat).     Marland Kitchen. PROAIR HFA 108 (90 Base) MCG/ACT inhaler Inhale 2 puffs into  the lungs every 4 (four) hours as needed for wheezing or shortness of breath.    Marland Kitchen. QVAR REDIHALER 80 MCG/ACT inhaler Inhale two doses twice daily to prevent cough or wheeze.  Rinse, gargle, and spit after use. (Patient taking differently: Inhale 2 puffs into the lungs 2 (two) times daily as needed (cough / wheeze). ) 11 g 5  . sodium chloride (OCEAN) 0.65 % SOLN nasal spray Place 1 spray into both nostrils as needed for congestion.      No results found for this or any previous visit (from the past 48 hour(s)). No results found.  Review of Systems  All other systems reviewed and are negative.   Blood pressure 125/74, pulse 84, temperature 98.3 F (36.8 C), temperature source Oral, resp. rate 18, height 5' 7.5" (1.715 m), weight 71.2 kg, last menstrual period 05/05/2013, SpO2 100 %. Physical Exam  Constitutional: She is oriented to person, place, and time. She appears well-developed and well-nourished. No distress.  HENT:  Head: Normocephalic and atraumatic.  Right Ear: External ear normal.  Left  Ear: External ear normal.  Nose: Nose normal.  Mouth/Throat: Oropharynx is clear and moist.  Eyes: Pupils are equal, round, and reactive to light. Conjunctivae and EOM are normal.  Neck: Normal range of motion. Neck supple.  Cardiovascular: Normal rate.  Respiratory: Effort normal.  Soft inspiratory stridor with deep breathing.  Neurological: She is alert and oriented to person, place, and time. No cranial nerve deficit.  Skin: Skin is warm and dry.  Psychiatric: She has a normal mood and affect. Her behavior is normal. Judgment and thought content normal.     Assessment/Plan Subglottic stenosis  To OR for SMDL with CO2 laser dilation and mitomycin C application.  Melida Quitter, MD 12/21/2018, 7:25 AM

## 2018-12-21 NOTE — Op Note (Signed)
NAME: Angela Duarte, RENBARGER MEDICAL RECORD [id number] ACCOUNT [id number] DATE OF BIRTH:Mar 01, 1961 FACILITY: [facility] LOCATION: [facility] PHYSICIAN:Unique Searfoss Guido Sander, MD  OPERATIVE REPORT  DATE OF PROCEDURE:  12/21/2018  PREOPERATIVE DIAGNOSIS:  Subglottic stenosis.  POSTOPERATIVE DIAGNOSIS:  Subglottic stenosis.  PROCEDURE:  Suspended microdirect laryngoscopy with CO2 laser dilation and mitomycin C application.  SURGEON:  Melida Quitter, MD  ANESTHESIA:  General jet Venturi ventilation.  COMPLICATIONS:  None.  INDICATIONS:  The patient is a 58 year old female with progressive shortness of breath and noisy breathing who was found to have a stenosis of the subglottis or upper trachea in the office and presented to the operating room for surgical management.  FINDINGS:  There was a 50% stenosis at the lower subglottis with the anterior and right side  segments mostly contributing to the scar, but with a dynamic posterior tracheal wall that also collapsed in to contribute.  The airway was able to be dilated  with the largest tracheal balloon and mitomycin C applied.  DESCRIPTION OF PROCEDURE:  The patient was identified in the holding room, informed consent having been obtained including discussion of risks, benefits, and alternatives.  The patient was brought to the operative suite and put on the operative table in  supine position.  Anesthesia was induced and the patient was maintained via mask ventilation.  The bed was turned 90 degrees from anesthesia.  The eyes were taped closed.  A tooth guard was placed over the upper teeth, and a damp eye patch were taped  over the eyes.  One of the Stortz laryngoscopes was inserted first and could really well expose the glottis, so this was changed out for the triangular Stortz laryngoscope, which gave a better exposure.  This was placed in suspension on the Mayo stand  using the Lewy arm.  Jet ventilation was initiated.  The 0-degree telescope was used  to make a preoperative photograph.  The operating microscope was then brought into the field with CO2 laser set on 4 watts.  It was then used to make radial cuts at 12  o'clock, 3 o'clock, and 9 o'clock.  After this was completed, the large tracheal balloon was inserted and inflated to 7 atmospheres for 60 seconds.  It was then brought out and the airway suctioned.  An additional laser was applied to the 3 o'clock and  12 o'clock position.  The balloon was inserted a second time and inflated to 7 atmospheres for 60 seconds.  The balloon was then brought out, and jet Venturi ventilation resumed.  A segment of a pledget sutured around a section of tubing was then soaked  with mitomycin C 0.4 mg/mL, 0.5 mL, and the pledget was then placed into the airway and held in place during jet ventilation through the tubing for 3 minutes.  The pledget and tubing were then removed, and the airway was suctioned.  A postoperative  photograph was made.  The laryngoscope was taken out of suspension and removed from the patient's mouth while suctioning the airway while spraying the larynx topically with 2% lidocaine.  The laryngoscope was taken out of suspension and removed from the  patient's mouth and the tooth guard was removed.  She was returned to mask ventilation, turned back to anesthesia for wakeup, and was moved to recovery room in stable condition.  LN/NUANCE  D:12/21/2018 T:12/21/2018 JOB:[id number]

## 2018-12-21 NOTE — Brief Op Note (Signed)
12/21/2018  8:31 AM  PATIENT:  Angela Duarte  58 y.o. female  PRE-OPERATIVE DIAGNOSIS:  Subglottic stenosis  POST-OPERATIVE DIAGNOSIS:  Subglottic stenosis  PROCEDURE:  Procedure(s): Micro laryngoscopy (N/A) Mitomycin C Application Co2 Laser Application Balloon Dilation  SURGEON:  Surgeon(s) and Role:    Melida Quitter, MD - Primary  PHYSICIAN ASSISTANT:   ASSISTANTS: none   ANESTHESIA:   general  EBL:  Minimal   BLOOD ADMINISTERED:none  DRAINS: none   LOCAL MEDICATIONS USED:  NONE  SPECIMEN:  No Specimen  DISPOSITION OF SPECIMEN:  N/A  COUNTS:  YES  TOURNIQUET:  * No tourniquets in log *  DICTATION: .Other Dictation: Dictation Number [id number]  PLAN OF CARE: Discharge to home after PACU  PATIENT DISPOSITION:  PACU - hemodynamically stable.   Delay start of Pharmacological VTE agent (>24hrs) due to surgical blood loss or risk of bleeding: no

## 2018-12-21 NOTE — Transfer of Care (Signed)
Immediate Anesthesia Transfer of Care Note  Patient: Angela Duarte  Procedure(s) Performed: Micro laryngoscopy (N/A Throat) Mitomycin C Application (Throat) Co2 Laser Application (Throat) Balloon Dilation (Throat)  Patient Location: PACU  Anesthesia Type:General  Level of Consciousness: awake, oriented and drowsy  Airway & Oxygen Therapy: Patient Spontanous Breathing and Patient connected to face mask oxygen  Post-op Assessment: Report given to RN, Post -op Vital signs reviewed and stable and Patient moving all extremities  Post vital signs: Reviewed and stable  Last Vitals:  Vitals Value Taken Time  BP 113/56 12/21/18 0837  Temp    Pulse 81 12/21/18 0839  Resp 25 12/21/18 0839  SpO2 98 % 12/21/18 0839  Vitals shown include unvalidated device data.  Last Pain:  Vitals:   12/21/18 0621  TempSrc:   PainSc: 0-No pain      Patients Stated Pain Goal: 3 (66/06/30 1601)  Complications: No apparent anesthesia complications

## 2018-12-21 NOTE — Anesthesia Procedure Notes (Signed)
Procedure Name: General with mask airway Performed by: Milford Cage, CRNA Pre-anesthesia Checklist: Patient identified, Emergency Drugs available, Suction available and Patient being monitored Patient Re-evaluated:Patient Re-evaluated prior to induction Oxygen Delivery Method: Circle system utilized Preoxygenation: Pre-oxygenation with 100% oxygen Induction Type: IV induction Ventilation: Mask ventilation without difficulty Comments: Jet Ventilation/intermittent mask ventilation

## 2018-12-21 NOTE — Anesthesia Postprocedure Evaluation (Signed)
Anesthesia Post Note  Patient: Angela Duarte  Procedure(s) Performed: Micro laryngoscopy (N/A Throat) Mitomycin C Application (Throat) Co2 Laser Application (Throat) Balloon Dilation (Throat)     Patient location during evaluation: PACU Anesthesia Type: General Level of consciousness: awake and alert Pain management: pain level controlled Vital Signs Assessment: post-procedure vital signs reviewed and stable Respiratory status: spontaneous breathing, nonlabored ventilation and respiratory function stable Cardiovascular status: blood pressure returned to baseline and stable Postop Assessment: no apparent nausea or vomiting Anesthetic complications: no    Last Vitals:  Vitals:   12/21/18 0852 12/21/18 0907  BP: 122/75 124/78  Pulse: 77 76  Resp: 17 (!) 24  Temp:    SpO2: 99% 99%    Last Pain:  Vitals:   12/21/18 0907  TempSrc:   PainSc: [place] E Shahmeer Bunn

## 2018-12-22 ENCOUNTER — Encounter (HOSPITAL_COMMUNITY): Payer: Self-pay | Admitting: Otolaryngology

## 2019-06-20 ENCOUNTER — Other Ambulatory Visit: Payer: Self-pay | Admitting: Internal Medicine

## 2019-06-20 DIAGNOSIS — Z1231 Encounter for screening mammogram for malignant neoplasm of breast: Secondary | ICD-10-CM

## 2019-06-24 ENCOUNTER — Encounter: Payer: Self-pay | Admitting: Certified Nurse Midwife

## 2019-09-05 ENCOUNTER — Other Ambulatory Visit: Payer: Self-pay

## 2019-09-05 ENCOUNTER — Ambulatory Visit
Admission: RE | Admit: 2019-09-05 | Discharge: 2019-09-05 | Disposition: A | Discharge status: Home / Self Care | Payer: BC Managed Care – PPO | Source: Ambulatory Visit | Attending: Internal Medicine | Admitting: Internal Medicine

## 2019-09-05 ENCOUNTER — Ambulatory Visit: Payer: BC Managed Care – PPO

## 2019-09-05 DIAGNOSIS — Z1231 Encounter for screening mammogram for malignant neoplasm of breast: Secondary | ICD-10-CM

## 2019-11-21 IMAGING — CT CT NECK WITH CONTRAST
5 of 7 series · 14 of 33 positions shown, 15 images · IV contrast (iopamidol)
Comparison: None.

CLINICAL DATA: Stridor. Sore throat rule out tonsillitis or
epiglottitis.

EXAM:
CT NECK WITH CONTRAST
TECHNIQUE: Multidetector CT imaging of the neck was performed using the
standard protocol following the bolus administration of intravenous
contrast.
CONTRAST:  75mL EU542A-XPP IOPAMIDOL (EU542A-XPP) INJECTION 61%

[Series 2: neck 2.00 br40 s3 st/ no angle · axial · 0.47mm/px · z∈[-710,-618]mm · 2 of 138 slices shown, 3 images]
[im 46/138  soft-tissue]
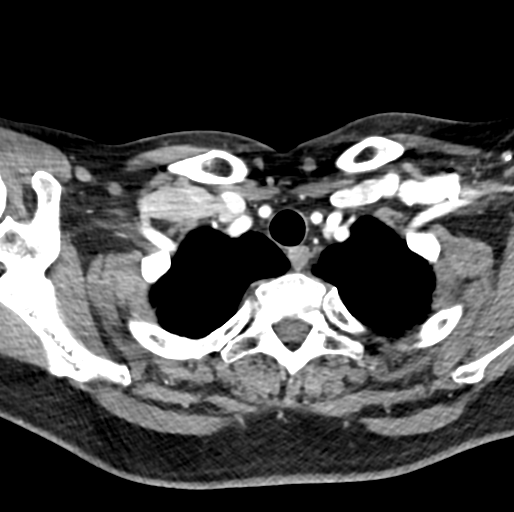
[im 46/138  bone]
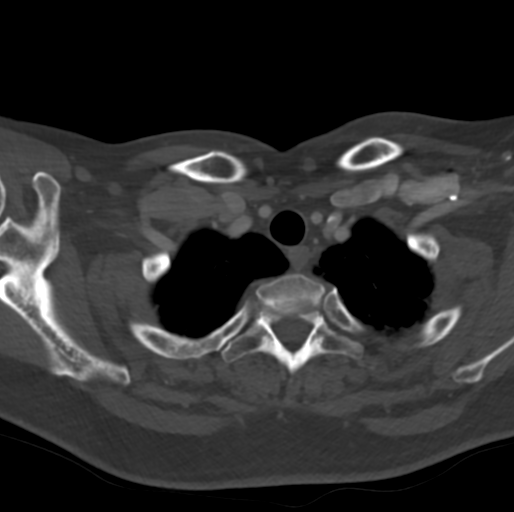
[im 92/138  bone]
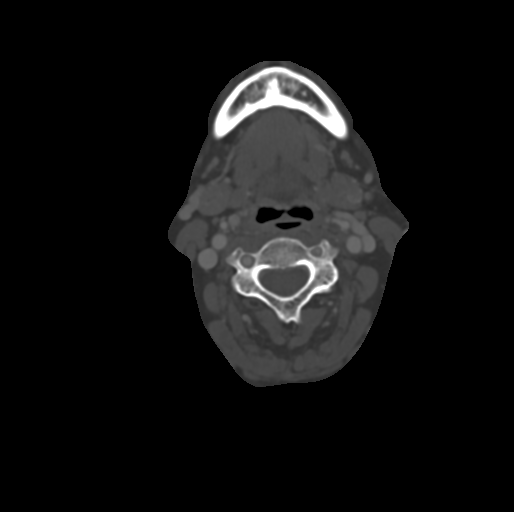

[Series 4: neck 2.00 br60 s3 bone/ no angle · axial · 0.47mm/px · z∈[-710,-618]mm · 2 of 138 slices shown]
[im 46/138  bone]
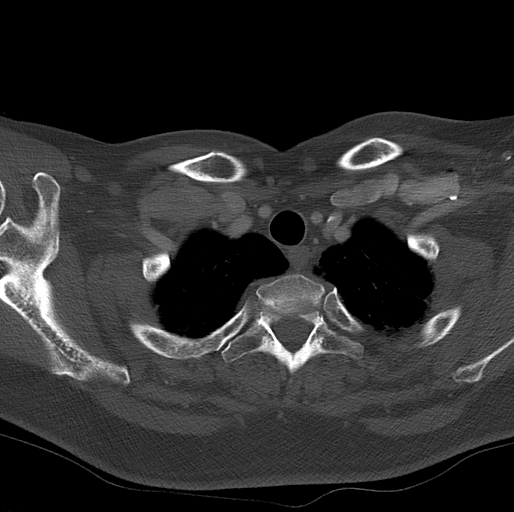
[im 92/138  bone]
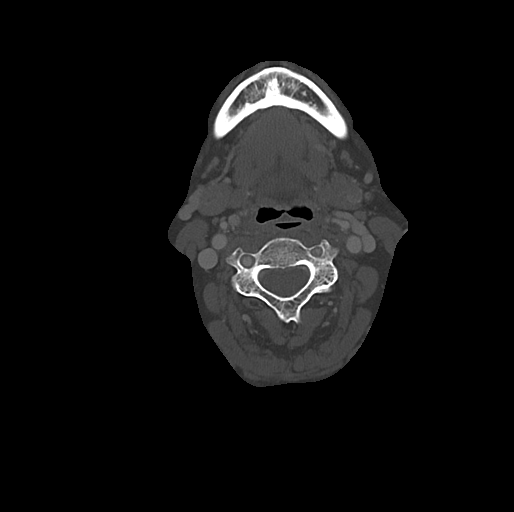

[Series 6: neck 2.00 br36 s3 angled axial (person_name) · axial · 0.47mm/px · z∈[-733,-643]mm · 2 of 138 slices shown]
[im 46/138  bone]
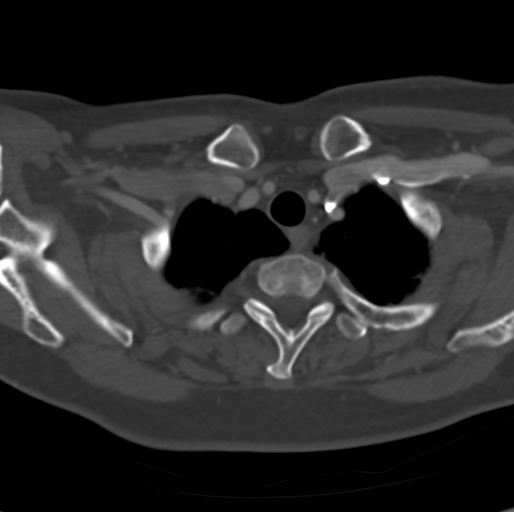
[im 92/138  bone]
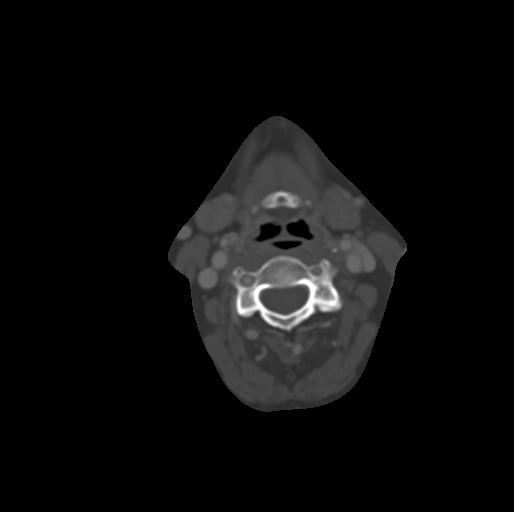

[Series 10: neck 2.00 br40 s3 (person_name) · coronal · 0.48mm/px · 3 of 121 slices shown (1 of 2)]
[im 25/121  bone]
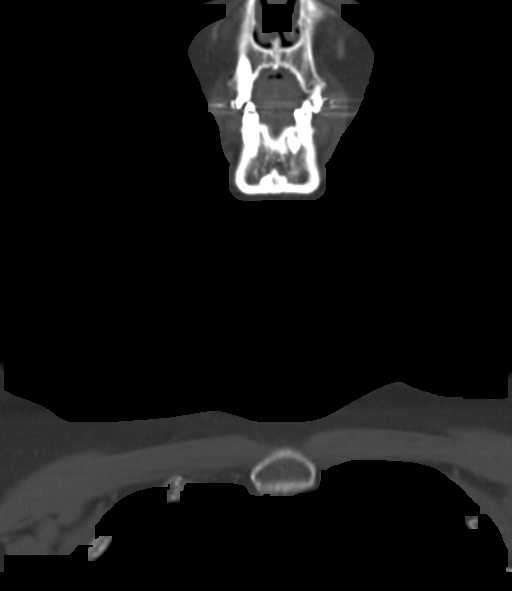
[im 49/121  bone]
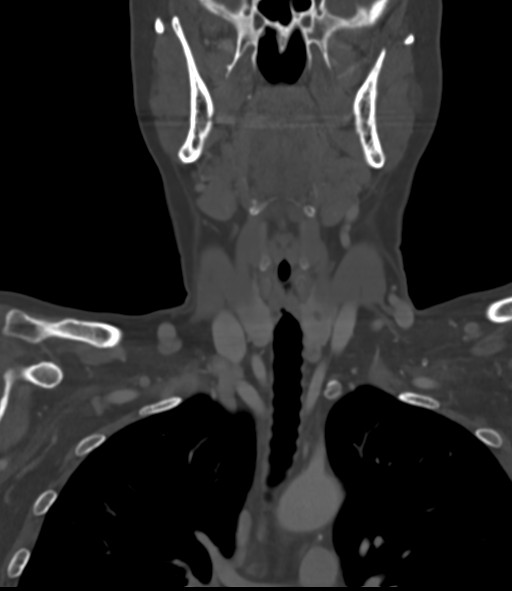
[im 73/121  bone]
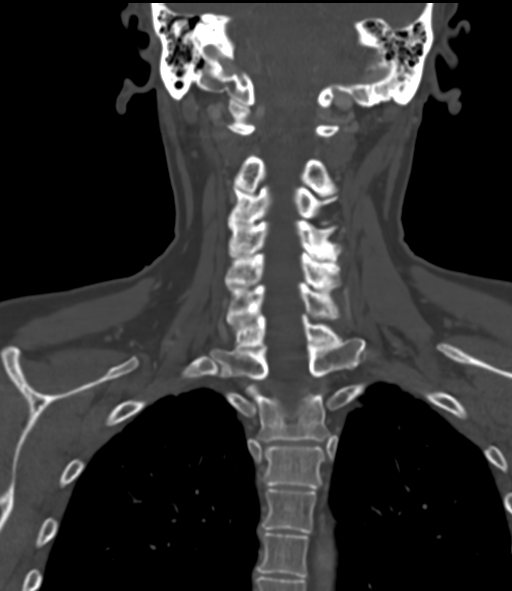

[Series 12: neck 2.00 br40 s3 (person_name) · sagittal · 0.26mm/px · 5 of 121 slices shown (2 of 2)]
[im 21/121  bone]
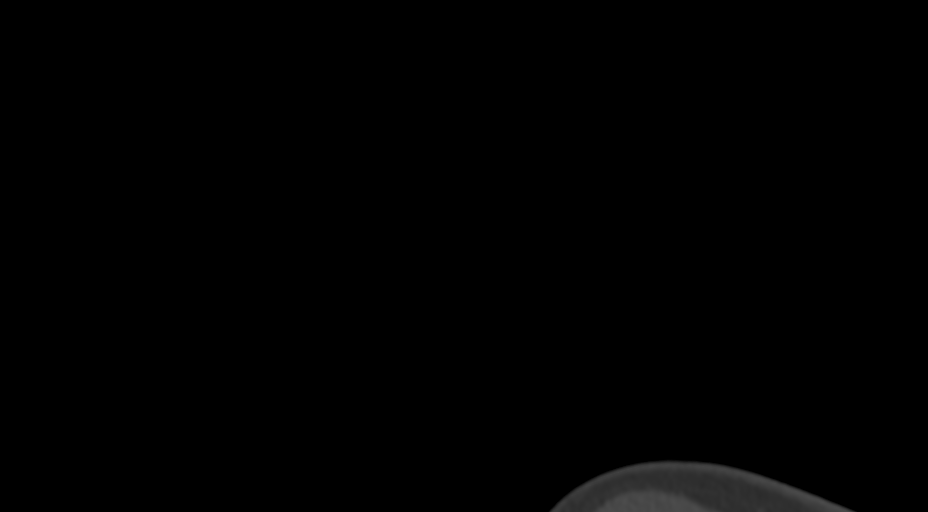
[im 41/121  bone]
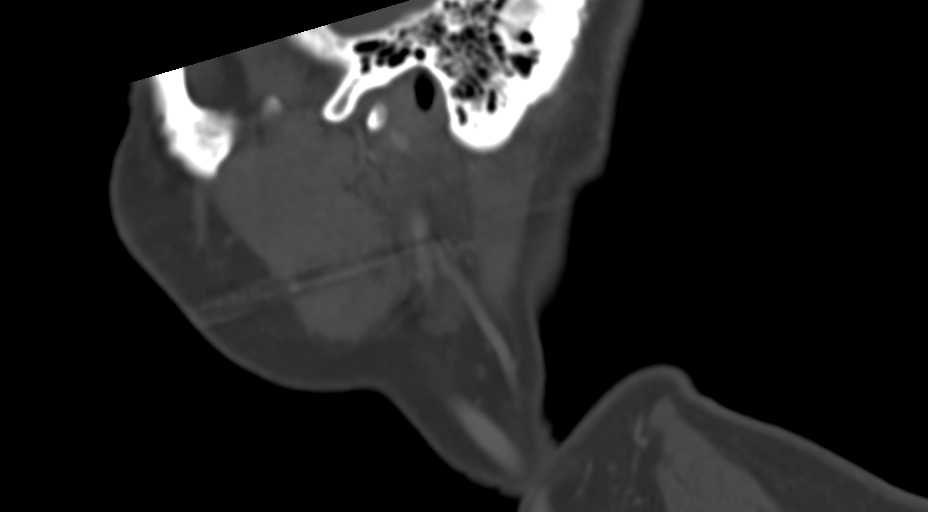
[im 61/121  bone]
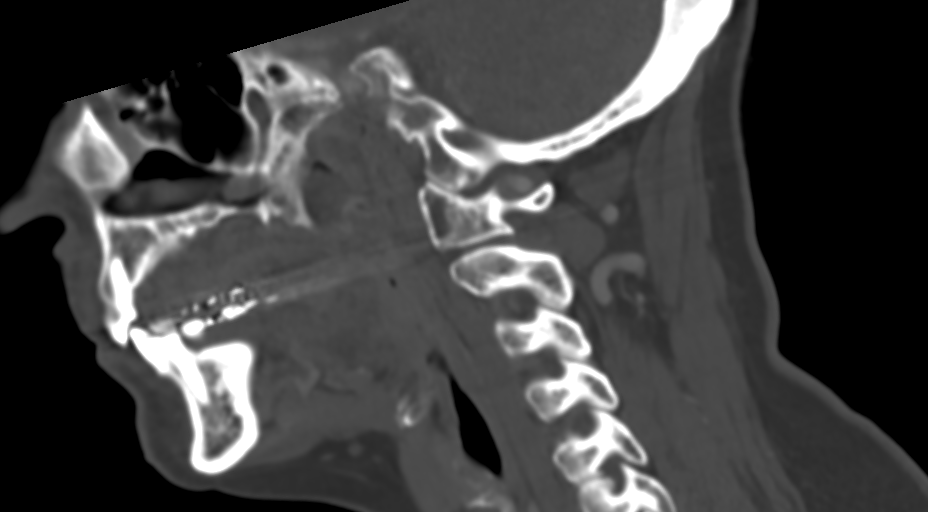
[im 81/121  bone]
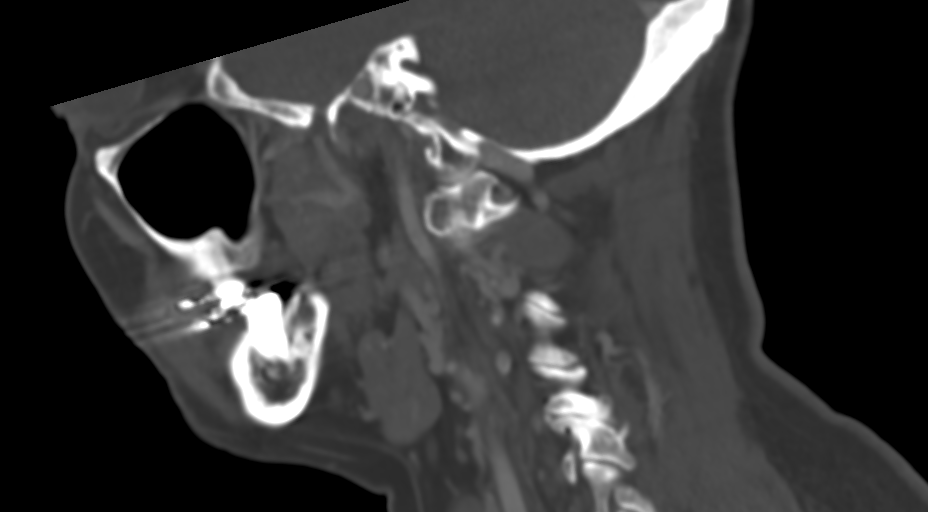
[im 101/121  bone]
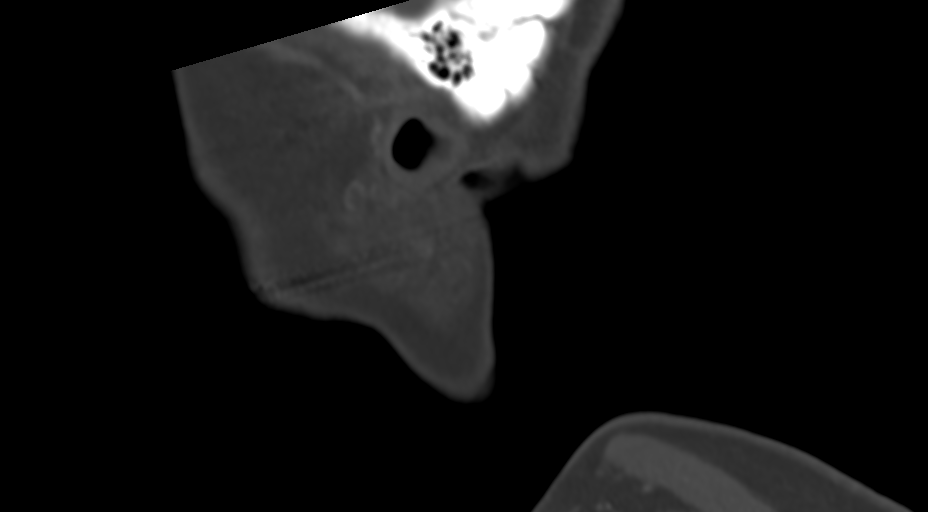

[14 of 33 positions shown; findings below may reference images not displayed]

FINDINGS: Pharynx and larynx: Oropharynx normal. Tongue is normal. No evidence
of tonsillar edema mass or abscess.

Normal larynx. 2.5 cm below the larynx and subglottic stenosis in
the trachea. Minimal luminal diameter at the level of the stenosis
is 9 x 10 mm. Normal tracheal diameter below the stenosis is 20 x 17
mm. No definite mass lesion although there is soft tissue thickening
associated with the stenosis.

Salivary glands: No inflammation, mass, or stone.

Thyroid: Thyroid normal in size.  Tiny thyroid nodules on the left.

Lymph nodes: No enlarged or pathologic lymph nodes in the neck.

Vascular: Normal vascular enhancement

Limited intracranial: Negative

Visualized orbits: Negative

Mastoids and visualized paranasal sinuses: Negative

Skeleton: No acute skeletal abnormality.

Upper chest: Lung apices well aerated and clear bilaterally

Other: None
IMPRESSION: Subglottic stenosis. Associated soft tissue thickening without
definite mass lesion. Direct mucosal evaluation recommended to rule
out neoplasm however there is no definite imaging evidence of tumor.

Negative for tonsillitis.

## 2020-09-09 ENCOUNTER — Encounter: Payer: Self-pay | Admitting: Genetic Counselor

## 2020-09-17 ENCOUNTER — Encounter: Payer: Self-pay | Admitting: Genetic Counselor

## 2021-05-27 ENCOUNTER — Other Ambulatory Visit: Payer: Self-pay | Admitting: Otolaryngology

## 2021-06-11 NOTE — Pre-Procedure Instructions (Signed)
Surgical Instructions ? ? ? Your procedure is scheduled on Wednesday 06/16/21. ? ? Report to [facility] Main Entrance "A" at 06:30 A.M., then check in with the Admitting office. ? Call this number if you have problems the morning of surgery: ? [phone] ? ? If you have any questions prior to your surgery date call [phone]: Open Monday-Friday 8am-4pm ? ? ? Remember: ? Do not eat or drink after midnight the night before your surgery ? ?  ? Take these medicines the morning of surgery with A SIP OF WATER:  ? cetirizine (ZYRTEC)  ? ? PROAIR HFA 108 (90 Base) MCG/ACT inhaler- If needed ? QVAR REDIHALER- If needed ? ? ?As of today, STOP taking any Aspirin (unless otherwise instructed by your surgeon) Aleve, Naproxen, Ibuprofen, Motrin, Advil, Goody's, BC's, all herbal medications, fish oil, and all vitamins. ? ?         ?Do not wear jewelry or makeup ?Do not wear lotions, powders, perfumes/colognes, or deodorant. ?Do not shave 48 hours prior to surgery.  Men may shave face and neck. ?Do not bring valuables to the hospital. ?Do not wear nail polish, gel polish, artificial nails, or any other type of covering on natural nails (fingers and toes) ?If you have artificial nails or gel coating that need to be removed by a nail salon, please have this removed prior to surgery. Artificial nails or gel coating may interfere with anesthesia's ability to adequately monitor your vital signs. ? ?[facility] is not responsible for any belongings or valuables. .  ? ?Do NOT Smoke (Tobacco/Vaping)  24 hours prior to your procedure ? ?If you use a CPAP at night, you may bring your mask for your overnight stay. ?  ?Contacts, glasses, hearing aids, dentures or partials may not be worn into surgery, please bring cases for these belongings ?  ?For patients admitted to the hospital, discharge time will be determined by your treatment team. ?  ?Patients discharged the day of surgery will not be allowed to drive home, and someone needs to  stay with them for 24 hours. ? ?NO VISITORS WILL BE ALLOWED IN PRE-OP WHERE PATIENTS ARE PREPPED FOR SURGERY.  ONLY 1 SUPPORT PERSON MAY BE PRESENT IN THE WAITING ROOM WHILE YOU ARE IN SURGERY.  IF YOU ARE TO BE ADMITTED, ONCE YOU ARE IN YOUR ROOM YOU WILL BE ALLOWED TWO (2) VISITORS. 1 (ONE) VISITOR MAY STAY OVERNIGHT BUT MUST ARRIVE TO THE ROOM BY 8pm.  Minor children may have two parents present. Special consideration for safety and communication needs will be reviewed on a case by case basis. ? ?Special instructions:   ? ?Oral Hygiene is also important to reduce your risk of infection.  Remember - BRUSH YOUR TEETH THE MORNING OF SURGERY WITH YOUR REGULAR TOOTHPASTE ? ? ?[place]- Preparing For Surgery ? ?Before surgery, you can play an important role. Because skin is not sterile, your skin needs to be as free of germs as possible. You can reduce the number of germs on your skin by washing with CHG (chlorahexidine gluconate) Soap before surgery.  CHG is an antiseptic cleaner which kills germs and bonds with the skin to continue killing germs even after washing.   ? ? ?Please do not use if you have an allergy to CHG or antibacterial soaps. If your skin becomes reddened/irritated stop using the CHG.  ?Do not shave (including legs and underarms) for at least 48 hours prior to first CHG shower. It is OK to  shave your face. ? ?Please follow these instructions carefully. ?  ? ? Shower the NIGHT BEFORE SURGERY and the MORNING OF SURGERY with CHG Soap.  ? If you chose to wash your hair, wash your hair first as usual with your normal shampoo. After you shampoo, rinse your hair and body thoroughly to remove the shampoo.  Then [employer] and genitals (private parts) with your normal soap and rinse thoroughly to remove soap. ? ?After that Use CHG Soap as you would any other liquid soap. You can apply CHG directly to the skin and wash gently with a scrungie or a clean washcloth.  ? ?Apply the CHG Soap to your body ONLY FROM  THE NECK DOWN.  Do not use on open wounds or open sores. Avoid contact with your eyes, ears, mouth and genitals (private parts). Wash Face and genitals (private parts)  with your normal soap.  ? ?Wash thoroughly, paying special attention to the area where your surgery will be performed. ? ?Thoroughly rinse your body with warm water from the neck down. ? ?DO NOT shower/wash with your normal soap after using and rinsing off the CHG Soap. ? ?Pat yourself dry with a CLEAN TOWEL. ? ?Wear CLEAN PAJAMAS to bed the night before surgery ? ?Place CLEAN SHEETS on your bed the night before your surgery ? ?DO NOT SLEEP WITH PETS. ? ? ?Day of Surgery: ? ?Take a shower with CHG soap. ?Wear Clean/Comfortable clothing the morning of surgery ?Do not apply any deodorants/lotions.   ?Remember to brush your teeth WITH YOUR REGULAR TOOTHPASTE. ? ? ? ?COVID testing ? ?If you are going to stay overnight or be admitted after your procedure/surgery and require a pre-op COVID test, please follow these instructions after your COVID test  ? ?You are not required to quarantine however you are required to wear a well-fitting mask when you are out and around people not in your household.  If your mask becomes wet or soiled, replace with a new one. ? ?Wash your hands often with soap and water for 20 seconds or clean your hands with an alcohol-based hand sanitizer that contains at least 60% alcohol. ? ?Do not share personal items. ? ?Notify your provider: ?if you are in close contact with someone who has COVID  ?or if you develop a fever of 100.4 or greater, sneezing, cough, sore throat, shortness of breath or body aches. ? ?  ?Please read over the following fact sheets that you were given.  ? ?

## 2021-06-14 ENCOUNTER — Encounter (HOSPITAL_COMMUNITY)
Admission: RE | Admit: 2021-06-14 | Discharge: 2021-06-14 | Disposition: A | Discharge status: Home / Self Care | Payer: BC Managed Care – PPO | Source: Ambulatory Visit | Attending: Otolaryngology | Admitting: Otolaryngology

## 2021-06-14 ENCOUNTER — Other Ambulatory Visit (HOSPITAL_COMMUNITY): Payer: BC Managed Care – PPO

## 2021-06-14 ENCOUNTER — Encounter (HOSPITAL_COMMUNITY): Payer: Self-pay

## 2021-06-14 ENCOUNTER — Other Ambulatory Visit: Payer: Self-pay

## 2021-06-14 VITALS — BP 126/79 | HR 93 | Temp 98.3°F | Resp 17 | Ht 67.0 in | Wt 164.4 lb

## 2021-06-14 DIAGNOSIS — K219 Gastro-esophageal reflux disease without esophagitis: Secondary | ICD-10-CM | POA: Diagnosis not present

## 2021-06-14 DIAGNOSIS — J45909 Unspecified asthma, uncomplicated: Secondary | ICD-10-CM | POA: Diagnosis not present

## 2021-06-14 DIAGNOSIS — Z01818 Encounter for other preprocedural examination: Secondary | ICD-10-CM

## 2021-06-14 DIAGNOSIS — Z79899 Other long term (current) drug therapy: Secondary | ICD-10-CM | POA: Diagnosis not present

## 2021-06-14 DIAGNOSIS — Z7951 Long term (current) use of inhaled steroids: Secondary | ICD-10-CM | POA: Diagnosis not present

## 2021-06-14 DIAGNOSIS — Z01812 Encounter for preprocedural laboratory examination: Secondary | ICD-10-CM | POA: Insufficient documentation

## 2021-06-14 DIAGNOSIS — M1990 Unspecified osteoarthritis, unspecified site: Secondary | ICD-10-CM | POA: Diagnosis not present

## 2021-06-14 DIAGNOSIS — J386 Stenosis of larynx: Secondary | ICD-10-CM | POA: Diagnosis present

## 2021-06-14 HISTORY — DX: Dyspnea, unspecified: R06.00

## 2021-06-14 LAB — CBC
HCT: 39 % (ref 36.0–46.0)
Hemoglobin: 13.7 g/dL (ref 12.0–15.0)
MCH: 32.9 pg (ref 26.0–34.0)
MCHC: 35.1 g/dL (ref 30.0–36.0)
MCV: 93.5 fL (ref 80.0–100.0)
Platelets: 241 10*3/uL (ref 150–400)
RBC: 4.17 MIL/uL (ref 3.87–5.11)
RDW: 11.5 % (ref 11.5–15.5)
WBC: 5.5 10*3/uL (ref 4.0–10.5)
nRBC: 0 % (ref 0.0–0.2)

## 2021-06-14 NOTE — Progress Notes (Signed)
PCP: [name], [facility] in [place], [place] ?Cardiologist: denies ? ?EKG: na ?CXR: 04/13/18 ?ECHO: denies ?Stress Test: denies ?Cardiac Cath: denies ? ?Fasting Blood Sugar- na ?Checks Blood Sugar__na_ times a day ? ?ASA/Blood Thinner: No ? ?OSA/CPAP: No ? ?Covid test not needed - Ambulatory ? ?Anesthesia Review: No ? ?Patient denies shortness of breath, fever, cough, and chest pain at PAT appointment. ? ?Patient verbalized understanding of instructions provided today at the PAT appointment.  Patient asked to review instructions at home and day of surgery.   ?

## 2021-06-14 NOTE — Pre-Procedure Instructions (Addendum)
Surgical Instructions ? ? ? Your procedure is scheduled on Wednesday 06/16/21. ? ? Report to [facility] Main Entrance "A" at 06:30 A.M., then check in with the Admitting office. ? Call this number if you have problems the morning of surgery: ? [phone] ? ? If you have any questions prior to your surgery date call [phone]: Open Monday-Friday 8am-4pm ? ? ? Remember: ? Do not eat or drink after midnight the night before your surgery ? ?  ? Take these medicines the morning of surgery with A SIP OF WATER:  ? cetirizine (ZYRTEC)  ? ? PROAIR HFA 108 (90 Base) MCG/ACT inhaler- If needed ? QVAR REDIHALER- If needed ? ? ?As of today, STOP taking any Aspirin (unless otherwise instructed by your surgeon) Aleve, Naproxen, Ibuprofen, Motrin, Advil, Goody's, BC's, all herbal medications, fish oil, and all vitamins. ? ?         ?Do not wear jewelry or makeup ?Do not wear lotions, powders, perfumes or deodorant. ?Do not shave 48 hours prior to surgery.   ?Do not bring valuables to the hospital. ?Do not wear nail polish, gel polish, artificial nails, or any other type of covering on natural nails (fingers and toes) ?If you have artificial nails or gel coating that need to be removed by a nail salon, please have this removed prior to surgery. Artificial nails or gel coating may interfere with anesthesia's ability to adequately monitor your vital signs. ? ?[facility] is not responsible for any belongings or valuables. .  ? ?Do NOT Smoke (Tobacco/Vaping)  24 hours prior to your procedure ? ?If you use a CPAP at night, you may bring your mask for your overnight stay. ?  ?Contacts, glasses, hearing aids, dentures or partials may not be worn into surgery, please bring cases for these belongings ?  ?For patients admitted to the hospital, discharge time will be determined by your treatment team. ?  ?Patients discharged the day of surgery will not be allowed to drive home, and someone needs to stay with them for 24 hours. ? ?NO  VISITORS WILL BE ALLOWED IN PRE-OP WHERE PATIENTS ARE PREPPED FOR SURGERY.  ONLY 1 SUPPORT PERSON MAY BE PRESENT IN THE WAITING ROOM WHILE YOU ARE IN SURGERY.  IF YOU ARE TO BE ADMITTED, ONCE YOU ARE IN YOUR ROOM YOU WILL BE ALLOWED TWO (2) VISITORS. 1 (ONE) VISITOR MAY STAY OVERNIGHT BUT MUST ARRIVE TO THE ROOM BY 8pm.  Minor children may have two parents present. Special consideration for safety and communication needs will be reviewed on a case by case basis. ? ?Special instructions:   ? ?Oral Hygiene is also important to reduce your risk of infection.  Remember - BRUSH YOUR TEETH THE MORNING OF SURGERY WITH YOUR REGULAR TOOTHPASTE ? ? ?[facility]- Preparing For Surgery ? ?Before surgery, you can play an important role. Because skin is not sterile, your skin needs to be as free of germs as possible. You can reduce the number of germs on your skin by washing with CHG (chlorahexidine gluconate) Soap before surgery.  CHG is an antiseptic cleaner which kills germs and bonds with the skin to continue killing germs even after washing.   ? ? ?Please do not use if you have an allergy to CHG or antibacterial soaps. If your skin becomes reddened/irritated stop using the CHG.  ?Do not shave (including legs and underarms) for at least 48 hours prior to first CHG shower. It is OK to shave your face. ? ?Please  follow these instructions carefully. ?  ? ? Shower the NIGHT BEFORE SURGERY and the MORNING OF SURGERY with CHG Soap.  ? If you chose to wash your hair, wash your hair first as usual with your normal shampoo. After you shampoo, rinse your hair and body thoroughly to remove the shampoo.  Then [employer] and genitals (private parts) with your normal soap and rinse thoroughly to remove soap. ? ?After that Use CHG Soap as you would any other liquid soap. You can apply CHG directly to the skin and wash gently with a scrungie or a clean washcloth.  ? ?Apply the CHG Soap to your body ONLY FROM THE NECK DOWN.  Do not use on open  wounds or open sores. Avoid contact with your eyes, ears, mouth and genitals (private parts). Wash Face and genitals (private parts)  with your normal soap.  ? ?Wash thoroughly, paying special attention to the area where your surgery will be performed. ? ?Thoroughly rinse your body with warm water from the neck down. ? ?DO NOT shower/wash with your normal soap after using and rinsing off the CHG Soap. ? ?Pat yourself dry with a CLEAN TOWEL. ? ?Wear CLEAN PAJAMAS to bed the night before surgery ? ?Place CLEAN SHEETS on your bed the night before your surgery ? ?DO NOT SLEEP WITH PETS. ? ? ?Day of Surgery: ? ?Take a shower with CHG soap. ?Wear Clean/Comfortable clothing the morning of surgery ?Do not apply any deodorants/lotions.   ?Remember to brush your teeth WITH YOUR REGULAR TOOTHPASTE. ? ? ? ? ?  ?Please read over the following fact sheets that you were given.  ? ?

## 2021-06-15 ENCOUNTER — Encounter (HOSPITAL_COMMUNITY): Payer: Self-pay | Admitting: Otolaryngology

## 2021-06-15 NOTE — Anesthesia Preprocedure Evaluation (Addendum)
Anesthesia Evaluation  ?Patient identified by MRN, date of birth, ID band ?Patient awake ? ? ? ?Reviewed: ?Allergy & Precautions, NPO status , Patient's Chart, lab work & pertinent test results ? ?Airway ?Mallampati: II ? ?TM Distance: >3 FB ?Neck ROM: Full ? ? ? Dental ?no notable dental hx. ?(+) Teeth Intact, Dental Advisory Given ?  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  ?Pulmonary exam normal ?breath sounds clear to auscultation ? ? ? ? ? ? Cardiovascular ?negative cardio ROS ?Normal cardiovascular exam ?Rhythm:Regular Rate:Normal ? ? ?  ?Neuro/Psych ?negative neurological ROS ? negative psych ROS  ? GI/Hepatic ?Neg liver ROS, GERD  ,  ?Endo/Other  ?negative endocrine ROS ? Renal/GU ?negative Renal ROS  ?negative genitourinary ?  ?Musculoskeletal ? ?(+) Arthritis , Osteoarthritis,  Degenerative arthritis thoracic spine  ? Abdominal ?  ?Peds ? Hematology ?negative hematology ROS ?(+)   ?Anesthesia Other Findings ? ? Reproductive/Obstetrics ? ?  ? ? ? ? ? ? ? ? ? ? ? ? ? ?  ?  ? ? ? ? ? ? ? ?Anesthesia Physical ?Anesthesia Plan ? ?ASA: 2 ? ?Anesthesia Plan: General  ? ?Post-op Pain Management: Minimal or no pain anticipated and Precedex  ? ?Induction: Intravenous ? ?PONV Risk Score and Plan: 4 or greater and Scopolamine patch - Pre-op, Midazolam, Dexamethasone, Ondansetron and Treatment may vary due to age or medical condition ? ?Airway Management Planned: Mask ? ?Additional Equipment:  ? ?Intra-op Plan:  ? ?Post-operative Plan: Extubation in OR ? ?Informed Consent: I have reviewed the patients History and Physical, chart, labs and discussed the procedure including the risks, benefits and alternatives for the proposed anesthesia with the patient or authorized representative who has indicated his/her understanding and acceptance.  ? ? ? ?Dental advisory given ? ?Plan Discussed with: CRNA and  Anesthesiologist ? ?Anesthesia Plan Comments: (Jet Ventilation with intermittent mask ventilation)  ? ? ? ? ? ?Anesthesia Quick Evaluation ? ?

## 2021-06-16 ENCOUNTER — Ambulatory Visit (HOSPITAL_COMMUNITY): Payer: BC Managed Care – PPO | Admitting: Certified Registered Nurse Anesthetist

## 2021-06-16 ENCOUNTER — Ambulatory Visit (HOSPITAL_COMMUNITY)
Admission: RE | Admit: 2021-06-16 | Discharge: 2021-06-16 | Disposition: A | Discharge status: Home / Self Care | Payer: BC Managed Care – PPO | Attending: Otolaryngology | Admitting: Otolaryngology

## 2021-06-16 ENCOUNTER — Encounter (HOSPITAL_COMMUNITY): Payer: Self-pay | Admitting: Otolaryngology

## 2021-06-16 ENCOUNTER — Encounter (HOSPITAL_COMMUNITY)
Admission: RE | Disposition: A | Discharge status: Home / Self Care | Payer: Self-pay | Source: Home / Self Care | Attending: Otolaryngology

## 2021-06-16 DIAGNOSIS — Z79899 Other long term (current) drug therapy: Secondary | ICD-10-CM | POA: Insufficient documentation

## 2021-06-16 DIAGNOSIS — K219 Gastro-esophageal reflux disease without esophagitis: Secondary | ICD-10-CM | POA: Insufficient documentation

## 2021-06-16 DIAGNOSIS — J386 Stenosis of larynx: Secondary | ICD-10-CM | POA: Diagnosis not present

## 2021-06-16 DIAGNOSIS — Z7951 Long term (current) use of inhaled steroids: Secondary | ICD-10-CM | POA: Insufficient documentation

## 2021-06-16 DIAGNOSIS — J45909 Unspecified asthma, uncomplicated: Secondary | ICD-10-CM | POA: Insufficient documentation

## 2021-06-16 DIAGNOSIS — M1990 Unspecified osteoarthritis, unspecified site: Secondary | ICD-10-CM | POA: Insufficient documentation

## 2021-06-16 SURGERY — MICROLARYNGOSCOPY WITH LASER AND BALLOON DILATION
Anesthesia: General

## 2021-06-16 MED ORDER — EPINEPHRINE PF 1 MG/ML IJ SOLN
INTRAMUSCULAR | Status: DC | PRN
  Administered 2021-06-16: 30 mL

## 2021-06-16 MED ORDER — PROPOFOL 10 MG/ML IV BOLUS
INTRAVENOUS | Status: AC
  Filled 2021-06-16: qty 20

## 2021-06-16 MED ORDER — DEXAMETHASONE SODIUM PHOSPHATE 10 MG/ML IJ SOLN
INTRAMUSCULAR | Status: DC | PRN
  Administered 2021-06-16: 5 mg via INTRAVENOUS

## 2021-06-16 MED ORDER — SUGAMMADEX SODIUM 200 MG/2ML IV SOLN
INTRAVENOUS | Status: DC | PRN
  Administered 2021-06-16: 200 mg via INTRAVENOUS

## 2021-06-16 MED ORDER — MIDAZOLAM HCL 2 MG/2ML IJ SOLN
INTRAMUSCULAR | Status: AC
  Filled 2021-06-16: qty 2

## 2021-06-16 MED ORDER — LIDOCAINE 2% (20 MG/ML) 5 ML SYRINGE
INTRAMUSCULAR | Status: DC | PRN
  Administered 2021-06-16: 60 mg via INTRAVENOUS

## 2021-06-16 MED ORDER — EPINEPHRINE HCL (NASAL) 0.1 % NA SOLN
NASAL | Status: AC
  Filled 2021-06-16: qty 30

## 2021-06-16 MED ORDER — PHENYLEPHRINE HCL-NACL 20-0.9 MG/250ML-% IV SOLN
INTRAVENOUS | Status: DC | PRN
  Administered 2021-06-16: 25 ug/min via INTRAVENOUS

## 2021-06-16 MED ORDER — OXYCODONE HCL 5 MG/5ML PO SOLN: 5.0000 mg | Freq: Once | ORAL | Status: DC | PRN

## 2021-06-16 MED ORDER — FENTANYL CITRATE (PF) 250 MCG/5ML IJ SOLN
INTRAMUSCULAR | Status: DC | PRN
  Administered 2021-06-16: 100 ug via INTRAVENOUS

## 2021-06-16 MED ORDER — ONDANSETRON HCL 4 MG/2ML IJ SOLN: 4.0000 mg | Freq: Once | INTRAMUSCULAR | Status: DC | PRN

## 2021-06-16 MED ORDER — MIDAZOLAM HCL 5 MG/5ML IJ SOLN
INTRAMUSCULAR | Status: DC | PRN
  Administered 2021-06-16: 1 mg via INTRAVENOUS

## 2021-06-16 MED ORDER — FENTANYL CITRATE (PF) 100 MCG/2ML IJ SOLN: 25.0000 ug | INTRAMUSCULAR | Status: DC | PRN

## 2021-06-16 MED ORDER — OXYCODONE HCL 5 MG PO TABS: 5.0000 mg | ORAL_TABLET | Freq: Once | ORAL | Status: DC | PRN

## 2021-06-16 MED ORDER — MITOMYCIN-C INJECTION USE IN OR ONLY (0.4 MG/ML)
1.0000 mL | Freq: Once | INTRAVENOUS | Status: DC
  Filled 2021-06-16: qty 1

## 2021-06-16 MED ORDER — ONDANSETRON HCL 4 MG/2ML IJ SOLN
INTRAMUSCULAR | Status: DC | PRN
  Administered 2021-06-16: 4 mg via INTRAVENOUS

## 2021-06-16 MED ORDER — AMISULPRIDE (ANTIEMETIC) 5 MG/2ML IV SOLN: 10.0000 mg | Freq: Once | INTRAVENOUS | Status: DC | PRN

## 2021-06-16 MED ORDER — PROPOFOL 500 MG/50ML IV EMUL
INTRAVENOUS | Status: DC | PRN
  Administered 2021-06-16: 150 ug/kg/min via INTRAVENOUS

## 2021-06-16 MED ORDER — PROPOFOL 10 MG/ML IV BOLUS
INTRAVENOUS | Status: DC | PRN
  Administered 2021-06-16: 120 mg via INTRAVENOUS

## 2021-06-16 MED ORDER — LACTATED RINGERS IV SOLN: INTRAVENOUS | Status: DC

## 2021-06-16 MED ORDER — CHLORHEXIDINE GLUCONATE 0.12 % MT SOLN
15.0000 mL | Freq: Once | OROMUCOSAL | Status: AC
  Administered 2021-06-16: 15 mL via OROMUCOSAL
  Filled 2021-06-16: qty 15

## 2021-06-16 MED ORDER — FENTANYL CITRATE (PF) 250 MCG/5ML IJ SOLN
INTRAMUSCULAR | Status: AC
  Filled 2021-06-16: qty 5

## 2021-06-16 MED ORDER — PHENYLEPHRINE HCL (PRESSORS) 10 MG/ML IV SOLN
INTRAVENOUS | Status: DC | PRN
  Administered 2021-06-16: 80 ug via INTRAVENOUS

## 2021-06-16 MED ORDER — ROCURONIUM BROMIDE 10 MG/ML (PF) SYRINGE
PREFILLED_SYRINGE | INTRAVENOUS | Status: DC | PRN
  Administered 2021-06-16: 50 mg via INTRAVENOUS

## 2021-06-16 MED ORDER — ORAL CARE MOUTH RINSE: 15.0000 mL | Freq: Once | OROMUCOSAL | Status: AC

## 2021-06-16 MED ORDER — MITOMYCIN-C INJECTION USE IN OR ONLY (0.4 MG/ML)
INTRAVENOUS | Status: DC | PRN
Start: 2021-06-16 — End: 2021-06-16
  Administered 2021-06-16: 1 mL

## 2021-06-16 SURGICAL SUPPLY — 27 items
BAG COUNTER SPONGE SURGICOUNT (BAG) ×3 IMPLANT
BAG SPNG CNTER NS LX DISP (BAG) ×1
BALLN PULM 15 16.5 18X75 (BALLOONS) ×2
BALLOON PULM 15 16.5 18X75 (BALLOONS) IMPLANT
BNDG EYE OVAL (GAUZE/BANDAGES/DRESSINGS) ×1 IMPLANT
CANISTER SUCT 3000ML PPV (MISCELLANEOUS) ×3 IMPLANT
CNTNR URN SCR LID CUP LEK RST (MISCELLANEOUS) IMPLANT
CONT SPEC 4OZ STRL OR WHT (MISCELLANEOUS)
COVER BACK TABLE 60X90IN (DRAPES) ×3 IMPLANT
GAUZE SPONGE 4X4 12PLY STRL (GAUZE/BANDAGES/DRESSINGS) ×2 IMPLANT
GLOVE SURG ENC MOIS LTX SZ7.5 (GLOVE) ×3 IMPLANT
GOWN STRL REUS W/ TWL LRG LVL3 (GOWN DISPOSABLE) IMPLANT
GOWN STRL REUS W/TWL LRG LVL3 (GOWN DISPOSABLE) ×2
KIT BASIN OR (CUSTOM PROCEDURE TRAY) ×3 IMPLANT
KIT TURNOVER KIT B (KITS) ×3 IMPLANT
NS IRRIG 1000ML POUR BTL (IV SOLUTION) ×3 IMPLANT
PAD ARMBOARD 7.5X6 YLW CONV (MISCELLANEOUS) ×6 IMPLANT
PATTIES SURGICAL .5 X1 (DISPOSABLE) ×1 IMPLANT
PATTIES SURGICAL .5 X3 (DISPOSABLE) ×3 IMPLANT
POSITIONER HEAD DONUT 9IN (MISCELLANEOUS) ×1 IMPLANT
SOL ANTI FOG 6CC (MISCELLANEOUS) IMPLANT
SOLUTION ANTI FOG 6CC (MISCELLANEOUS) ×1
SUT SILK 2 0 PERMA HAND 18 BK (SUTURE) ×1 IMPLANT
SYR GAUGE ALLIANCE SINGLE USE (MISCELLANEOUS) ×3 IMPLANT
TOWEL GREEN STERILE FF (TOWEL DISPOSABLE) ×6 IMPLANT
TUBE CONNECTING 12X1/4 (SUCTIONS) ×3 IMPLANT
WATER STERILE IRR 1000ML POUR (IV SOLUTION) ×3 IMPLANT

## 2021-06-16 NOTE — Transfer of Care (Signed)
Immediate Anesthesia Transfer of Care Note ? ?Patient: Angela Duarte ? ?Procedure(s) Performed: SUSPENDED DIRECT MICROLARYNGOSCOPY WITH CO2 LASER AND BALLOON DILATION ?MITOMYCIN C APPLICATION ? ?Patient Location: PACU ? ?Anesthesia Type:General ? ?Level of Consciousness: awake, patient cooperative and responds to stimulation ? ?Airway & Oxygen Therapy: Patient Spontanous Breathing and Patient connected to face mask oxygen ? ?Post-op Assessment: Report given to RN and Post -op Vital signs reviewed and stable ? ?Post vital signs: Reviewed and stable ? ?Last Vitals:  ?Vitals Value Taken Time  ?BP 126/70 06/16/21 0926  ?Temp    ?Pulse 88 06/16/21 0926  ?Resp 14 06/16/21 0926  ?SpO2 100 % 06/16/21 0926  ?Vitals shown include unvalidated device data. ? ?Last Pain:  ?Vitals:  ? 06/16/21 0700  ?TempSrc:   ?PainSc: 0-No pain  ?   ? ?  ? ?Complications: No notable events documented. ?

## 2021-06-16 NOTE — Op Note (Signed)
PREOPERATIVE DIAGNOSIS:  Subglottic stenosis ?  ?POSTOPERATIVE DIAGNOSIS:  Subglottic stenosis ?  ?PROCEDURE:  Suspended microdirect laryngoscopy with CO2 laser dilation and application of mitomycin C ?  ?SURGEON:  Christia Reading, MD ?  ?ANESTHESIA:  General with jet ventilation by anesthesia. ?  ?COMPLICATIONS:  None. ?  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  She has had gradual worsening of obstructive breathing in recent months.  She presents to the operating room for surgical management. ?  ?FINDINGS:  50% stenosis of the lower subglottic airway. ?  ?DESCRIPTION OF PROCEDURE:  The patient was identified in the holding room, informed consent having been obtained including discussion of risks, benefits and alternatives, the patient was brought to the operative suite and put the operative table in the supine position.  Anesthesia was induced and the patient was maintained via mask ventilation.  The eyes were taped closed and bed was turned 90 degrees from anesthesia.  The patient was given intravenous steroids during the ? case.  A tooth guard was placed over the upper teeth and a Stortz laryngoscope was used to expose the larynx.  The triangle shaped laryngoscope allowed visualization of the posterior larynx and was placed in suspension to Mayo stand using the Lewy arm.  Jet ventilation was initiated.  Damp eye pads were taped over the eyes and a damp towel was placed over the face.  Photodocumentation was performed with the zero degree telescope.  The operating microscope was then brought into the field with CO2 laser set on 4 watts.  It was then used to make radial cuts at 12 o'clock, 3 o'clock, and 9 o'clock.  After this was completed, the large tracheal balloon was inserted and inflated to 7 atmospheres for 60 seconds.  It was then brought out and the airway suctioned.  The balloon was inserted a second time and inflated to 7  atmospheres for 60 seconds.  The balloon was then brought out, and jet Venturi ventilation resumed.  A segment of a pledget soaked with mitomycin C 0.4 mg/mL, 0.5 mL, was then held to the surgical site for 2 minutes.  The pledget was removed, and the airway was suctioned.  A postoperative  ?photograph was made.  The laryngoscope was taken out of suspension and removed from the patient's mouth while suctioning the airway while spraying the larynx topically with 2% lidocaine.  The laryngoscope was taken out of suspension and removed from the patient's mouth and the tooth guard was removed.  She was returned to mask ventilation, turned back to anesthesia for wakeup, and was moved to recovery room in stable condition. ?

## 2021-06-16 NOTE — H&P (Signed)
Angela Duarte is an 61 y.o. female.   ?Chief Complaint: subglottic stenosis ?HPI: 61 year old female with subglottic stenosis who last required dilation in 2020.  She has had gradual worsening of obstructive breathing in recent months. ? ?Past Medical History:  ?Diagnosis Date  ? Asthma   ? Dyspnea   ? Family history of breast cancer   ? Fibrocystic breast changes   ? GERD (gastroesophageal reflux disease)   ? 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 Urethritis   ? post coital  ? ? ?Past Surgical History:  ?Procedure Laterality Date  ? APPENDECTOMY  1976  ? BALLOON DILATION  12/21/2018  ? Procedure: Balloon Dilation;  Surgeon: Christia Reading, MD;  Location: [facility] OR;  Service: ENT;;  ? CO2 LASER APPLICATION  12/21/2018  ? Procedure: Co2 Laser Application;  Surgeon: Christia Reading, MD;  Location: [facility] OR;  Service: ENT;;  ? LARYNGOSCOPY N/A 12/21/2018  ? Procedure: Micro laryngoscopy;  Surgeon: Christia Reading, MD;  Location: [facility] OR;  Service: ENT;  Laterality: N/A;  ? MITOMYCIN C APPLICATION  12/21/2018  ? Procedure: Mitomycin C Application;  Surgeon: Christia Reading, MD;  Location: [facility] OR;  Service: ENT;;  ? TUBAL LIGATION Bilateral 1994  ? URETER SURGERY  1968  ? reconstruction  ? WISDOM TOOTH EXTRACTION  1981  ? ? ?Family History  ?Problem Relation Age of Onset  ? Breast cancer Sister 110  ?     first occurence age 7, second at age 34  ? Pancreatic cancer Mother 67  ? Heart attack Brother   ? Breast cancer Maternal Aunt   ?     post menopausal  ? Breast cancer Paternal Aunt   ?     post menoausal  ? Breast cancer Maternal Aunt   ?     dx over 36  ? Lung cancer Maternal Aunt   ? Bladder Cancer Maternal Aunt   ? Breast cancer Cousin   ?     mat first cousin  ? Breast cancer Cousin   ?     2 additional mat first cousin  ? Lung cancer Paternal Uncle   ? Breast cancer Paternal Aunt   ?     dx over 73  ? Breast cancer Cousin   ?     pat cousin with possible breast cancer  ? ?Social History:  reports that she has never smoked.  She has never used smokeless tobacco. She reports current alcohol use. She reports that she does not use drugs. ? ?Allergies: No Known Allergies ? ?Medications Prior to Admission  ?Medication Sig Dispense Refill  ? cetirizine (ZYRTEC) 10 MG tablet Take 10 mg by mouth daily.    ? cholecalciferol (VITAMIN D3) 25 MCG (1000 UNIT) tablet Take 2,000 Units by mouth daily.    ? Omega-3 Fatty Acids (FISH OIL PO) Take 1 capsule by mouth daily.    ? PROAIR HFA 108 (90 Base) MCG/ACT inhaler Inhale 2 puffs into the lungs every 4 (four) hours as needed for wheezing or shortness of breath.    ? QVAR REDIHALER 80 MCG/ACT inhaler Inhale two doses twice daily to prevent cough or wheeze.  Rinse, gargle, and spit after use. (Patient taking differently: Inhale 2 puffs into the lungs 2 (two) times daily as needed (cough / wheeze).) 11 g 5  ? ? ?Results for orders placed or performed during the hospital encounter of 06/14/21 (from the past 48 hour(s))  ?CBC     Status:  None  ? Collection Time: 06/14/21  2:45 PM  ?Result Value Ref Range  ? WBC 5.5 4.0 - 10.5 K/uL  ? RBC 4.17 3.87 - 5.11 MIL/uL  ? Hemoglobin 13.7 12.0 - 15.0 g/dL  ? HCT 39.0 36.0 - 46.0 %  ? MCV 93.5 80.0 - 100.0 fL  ? MCH 32.9 26.0 - 34.0 pg  ? MCHC 35.1 30.0 - 36.0 g/dL  ? RDW 11.5 11.5 - 15.5 %  ? Platelets 241 150 - 400 K/uL  ? nRBC 0.0 0.0 - 0.2 %  ?  Comment: Performed at [facility], [address], [place], [place] [address]  ? ?No results found. ? ?Review of Systems  ?All other systems reviewed and are negative. ? ?Blood pressure 132/87, pulse 72, temperature 98.1 ?F (36.7 ?C), temperature source Oral, resp. rate 17, height 5\' 7"  (1.702 m), weight 72.6 kg, last menstrual period 05/05/2013, SpO2 99 %. ?Physical Exam ?Constitutional:   ?   Appearance: Normal appearance. She is normal weight.  ?HENT:  ?   Head: Normocephalic and atraumatic.  ?   Right Ear: External ear normal.  ?   Left Ear: External ear normal.  ?   Nose: Nose normal.  ?   Mouth/Throat:  ?    Mouth: Mucous membranes are moist.  ?   Pharynx: Oropharynx is clear.  ?Eyes:  ?   Extraocular Movements: Extraocular movements intact.  ?   Conjunctiva/sclera: Conjunctivae normal.  ?   Pupils: Pupils are equal, round, and reactive to light.  ?Cardiovascular:  ?   Rate and Rhythm: Normal rate.  ?Pulmonary:  ?   Effort: Pulmonary effort is normal.  ?Musculoskeletal:  ?   Cervical back: Normal range of motion.  ?Skin: ?   General: Skin is warm and dry.  ?Neurological:  ?   General: No focal deficit present.  ?   Mental Status: She is alert and oriented to person, place, and time.  ?Psychiatric:     ?   Mood and Affect: Mood normal.     ?   Behavior: Behavior normal.     ?   Thought Content: Thought content normal.     ?   Judgment: Judgment normal.  ?  ? ?Assessment/Plan ?Subglottic stenosis ? ?To OR for SMDL with CO2 laser dilation with mitomycin C application. ? ?07/03/2013, MD ?06/16/2021, 8:16 AM ? ? ? ?

## 2021-06-16 NOTE — Brief Op Note (Signed)
06/16/2021 ? ?9:15 AM ? ?PATIENT:  Angela Duarte  61 y.o. female ? ?PRE-OPERATIVE DIAGNOSIS:  Subglottic stenosis ? ?POST-OPERATIVE DIAGNOSIS:  Subglottic stenosis ? ?PROCEDURE:  Procedure(s): ?SUSPENDED DIRECT MICROLARYNGOSCOPY WITH CO2 LASER AND BALLOON DILATION (N/A) ?MITOMYCIN C APPLICATION (N/A) ? ?SURGEON:  Surgeon(s) and Role: ?   Christia Reading, MD - Primary ? ?PHYSICIAN ASSISTANT:  ? ?ASSISTANTS: none  ? ?ANESTHESIA:   general ? ?EBL:  Minimal  ? ?BLOOD ADMINISTERED:none ? ?DRAINS: none  ? ?LOCAL MEDICATIONS USED:  NONE ? ?SPECIMEN:  No Specimen ? ?DISPOSITION OF SPECIMEN:  N/A ? ?COUNTS:  YES ? ?TOURNIQUET:  * No tourniquets in log * ? ?DICTATION: .Note written in EPIC ? ?PLAN OF CARE: Discharge to home after PACU ? ?PATIENT DISPOSITION:  PACU - hemodynamically stable. ?  ?Delay start of Pharmacological VTE agent (>24hrs) due to surgical blood loss or risk of bleeding: no ? ?

## 2021-06-16 NOTE — Anesthesia Postprocedure Evaluation (Signed)
Anesthesia Post Note ? ?Patient: Angela Duarte ? ?Procedure(s) Performed: SUSPENDED DIRECT MICROLARYNGOSCOPY WITH CO2 LASER AND BALLOON DILATION ?MITOMYCIN C APPLICATION ? ?  ? ?Patient location during evaluation: Phase II ?Anesthesia Type: General ?Level of consciousness: awake and alert and oriented ?Pain management: pain level controlled ?Vital Signs Assessment: post-procedure vital signs reviewed and stable ?Respiratory status: spontaneous breathing, nonlabored ventilation and respiratory function stable ?Cardiovascular status: blood pressure returned to baseline and stable ?Postop Assessment: no apparent nausea or vomiting ?Anesthetic complications: no ? ? ?No notable events documented. ? ?Last Vitals:  ?Vitals:  ? 06/16/21 0956 06/16/21 1011  ?BP: (!) 152/62 112/82  ?Pulse: 65 (!) 58  ?Resp: 14 14  ?Temp:  36.5 ?C  ?SpO2: 100% 100%  ?  ?Last Pain:  ?Vitals:  ? 06/16/21 1011  ?TempSrc:   ?PainSc: 0-No pain  ? ? ?  ?  ?  ?  ?  ?  ? ?Sherial Ebrahim A. ? ? ? ? ?

## 2021-06-17 ENCOUNTER — Encounter (HOSPITAL_COMMUNITY): Payer: Self-pay | Admitting: Otolaryngology

## 2023-06-07 ENCOUNTER — Encounter: Payer: Self-pay | Admitting: Genetic Counselor

## 2023-06-22 ENCOUNTER — Other Ambulatory Visit: Payer: Self-pay | Admitting: Otolaryngology

## 2023-07-04 ENCOUNTER — Encounter (HOSPITAL_COMMUNITY): Payer: Self-pay | Admitting: Otolaryngology

## 2023-07-04 ENCOUNTER — Other Ambulatory Visit: Payer: Self-pay

## 2023-07-04 NOTE — Progress Notes (Signed)
 PCP -  [name], PA-C at [facility] Cardiologist - none  Chest x-ray - n/a EKG - n/a Stress Test - n/a ECHO - n/a Cardiac Cath - n/a  ICD Pacemaker/Loop - n/a  Sleep Study -  n/a  Diabetes - n/a  Aspirin & Blood Thinner Instructions:  n/a  NPO  Anesthesia review: no  STOP now taking any Aspirin (unless otherwise instructed by your surgeon), Aleve, Naproxen, Ibuprofen, Motrin, Advil, Goody's, BC's, all herbal medications, fish oil, and all vitamins.   Coronavirus Screening Do you have any of the following symptoms:  Cough yes/no: No Fever (>100.54F)  yes/no: No Runny nose yes/no: No Sore throat yes/no: No Difficulty breathing/shortness of breath  Yes  Have you traveled in the last 14 days and where? yes/no: No  Patient verbalized understanding of instructions that were given via phone.

## 2023-07-05 ENCOUNTER — Encounter (HOSPITAL_COMMUNITY)
Admission: RE | Disposition: A | Discharge status: Home / Self Care | Payer: Self-pay | Source: Home / Self Care | Attending: Otolaryngology

## 2023-07-05 ENCOUNTER — Ambulatory Visit (HOSPITAL_COMMUNITY): Admitting: Anesthesiology

## 2023-07-05 ENCOUNTER — Ambulatory Visit (HOSPITAL_COMMUNITY)
Admission: RE | Admit: 2023-07-05 | Discharge: 2023-07-05 | Disposition: A | Discharge status: Home / Self Care | Attending: Otolaryngology | Admitting: Otolaryngology

## 2023-07-05 DIAGNOSIS — J45909 Unspecified asthma, uncomplicated: Secondary | ICD-10-CM | POA: Diagnosis not present

## 2023-07-05 DIAGNOSIS — Z79899 Other long term (current) drug therapy: Secondary | ICD-10-CM | POA: Insufficient documentation

## 2023-07-05 DIAGNOSIS — M1990 Unspecified osteoarthritis, unspecified site: Secondary | ICD-10-CM | POA: Insufficient documentation

## 2023-07-05 DIAGNOSIS — J386 Stenosis of larynx: Secondary | ICD-10-CM | POA: Insufficient documentation

## 2023-07-05 DIAGNOSIS — K219 Gastro-esophageal reflux disease without esophagitis: Secondary | ICD-10-CM | POA: Diagnosis not present

## 2023-07-05 HISTORY — PX: MICROLARYNGOSCOPY WITH LASER: SHX5972

## 2023-07-05 HISTORY — DX: Herpesviral vesicular dermatitis: B00.1

## 2023-07-05 HISTORY — DX: Other seasonal allergic rhinitis: J30.2

## 2023-07-05 SURGERY — MICROLARYNGOSCOPY, WITH PROCEDURE USING LASER
Anesthesia: General | Laterality: Bilateral

## 2023-07-05 MED ORDER — PROPOFOL 10 MG/ML IV BOLUS
INTRAVENOUS | Status: AC
  Filled 2023-07-05: qty 20

## 2023-07-05 MED ORDER — LIDOCAINE 2% (20 MG/ML) 5 ML SYRINGE
INTRAMUSCULAR | Status: AC
  Filled 2023-07-05: qty 5

## 2023-07-05 MED ORDER — EPINEPHRINE HCL (NASAL) 0.1 % NA SOLN
NASAL | Status: AC
  Filled 2023-07-05: qty 30

## 2023-07-05 MED ORDER — MIDAZOLAM HCL 2 MG/2ML IJ SOLN
INTRAMUSCULAR | Status: AC
  Filled 2023-07-05: qty 2

## 2023-07-05 MED ORDER — OXYCODONE HCL 5 MG PO TABS: 5.0000 mg | ORAL_TABLET | Freq: Once | ORAL | Status: DC | PRN

## 2023-07-05 MED ORDER — CEFAZOLIN SODIUM-DEXTROSE 2-3 GM-%(50ML) IV SOLR
INTRAVENOUS | Status: DC | PRN
  Administered 2023-07-05: 2 g via INTRAVENOUS

## 2023-07-05 MED ORDER — LIDOCAINE 2% (20 MG/ML) 5 ML SYRINGE
INTRAMUSCULAR | Status: DC | PRN
  Administered 2023-07-05: 100 mg via INTRAVENOUS

## 2023-07-05 MED ORDER — PROPOFOL 10 MG/ML IV BOLUS
INTRAVENOUS | Status: DC | PRN
  Administered 2023-07-05: 12 mg via INTRAVENOUS

## 2023-07-05 MED ORDER — NEOSTIGMINE METHYLSULFATE 3 MG/3ML IV SOSY
PREFILLED_SYRINGE | INTRAVENOUS | Status: AC
  Filled 2023-07-05: qty 3

## 2023-07-05 MED ORDER — CEFAZOLIN SODIUM-DEXTROSE 2-4 GM/100ML-% IV SOLN
2.0000 g | INTRAVENOUS | Status: DC
  Filled 2023-07-05: qty 100

## 2023-07-05 MED ORDER — ARTIFICIAL TEARS OPHTHALMIC OINT
TOPICAL_OINTMENT | OPHTHALMIC | Status: AC
  Filled 2023-07-05: qty 3.5

## 2023-07-05 MED ORDER — HYDROMORPHONE HCL 1 MG/ML IJ SOLN: 0.2500 mg | INTRAMUSCULAR | Status: DC | PRN

## 2023-07-05 MED ORDER — MITOMYCIN-C INJECTION USE IN OR ONLY (0.4 MG/ML)
0.5000 mL | Freq: Once | INTRAVENOUS | Status: DC
  Filled 2023-07-05: qty 0.5

## 2023-07-05 MED ORDER — CHLORHEXIDINE GLUCONATE 0.12 % MT SOLN
OROMUCOSAL | Status: AC
  Administered 2023-07-05: 15 mL via OROMUCOSAL
  Filled 2023-07-05: qty 15

## 2023-07-05 MED ORDER — OXYCODONE HCL 5 MG/5ML PO SOLN: 5.0000 mg | Freq: Once | ORAL | Status: DC | PRN

## 2023-07-05 MED ORDER — LACTATED RINGERS IV SOLN: INTRAVENOUS | Status: DC | PRN

## 2023-07-05 MED ORDER — ORAL CARE MOUTH RINSE: 15.0000 mL | Freq: Once | OROMUCOSAL | Status: AC

## 2023-07-05 MED ORDER — ROCURONIUM BROMIDE 10 MG/ML (PF) SYRINGE
PREFILLED_SYRINGE | INTRAVENOUS | Status: DC | PRN
  Administered 2023-07-05: 30 mg via INTRAVENOUS

## 2023-07-05 MED ORDER — DEXAMETHASONE SODIUM PHOSPHATE 10 MG/ML IJ SOLN
INTRAMUSCULAR | Status: DC | PRN
  Administered 2023-07-05: 10 mg via INTRAVENOUS

## 2023-07-05 MED ORDER — ACETAMINOPHEN 10 MG/ML IV SOLN: 1000.0000 mg | Freq: Once | INTRAVENOUS | Status: DC | PRN

## 2023-07-05 MED ORDER — ROCURONIUM BROMIDE 10 MG/ML (PF) SYRINGE
PREFILLED_SYRINGE | INTRAVENOUS | Status: AC
  Filled 2023-07-05: qty 10

## 2023-07-05 MED ORDER — DEXMEDETOMIDINE HCL IN NACL 80 MCG/20ML IV SOLN
INTRAVENOUS | Status: DC | PRN
  Administered 2023-07-05 (×2): 8 ug via INTRAVENOUS

## 2023-07-05 MED ORDER — FENTANYL CITRATE (PF) 250 MCG/5ML IJ SOLN
INTRAMUSCULAR | Status: AC
  Filled 2023-07-05: qty 5

## 2023-07-05 MED ORDER — DROPERIDOL 2.5 MG/ML IJ SOLN: 0.6250 mg | Freq: Once | INTRAMUSCULAR | Status: DC | PRN

## 2023-07-05 MED ORDER — EPINEPHRINE PF 1 MG/ML IJ SOLN
INTRAMUSCULAR | Status: DC | PRN
  Administered 2023-07-05: .3 mg via ENDOTRACHEOPULMONARY

## 2023-07-05 MED ORDER — EPHEDRINE 5 MG/ML INJ
INTRAVENOUS | Status: AC
  Filled 2023-07-05: qty 5

## 2023-07-05 MED ORDER — ONDANSETRON HCL 4 MG/2ML IJ SOLN: 4.0000 mg | Freq: Once | INTRAMUSCULAR | Status: DC | PRN

## 2023-07-05 MED ORDER — CHLORHEXIDINE GLUCONATE 0.12 % MT SOLN: 15.0000 mL | Freq: Once | OROMUCOSAL | Status: AC

## 2023-07-05 MED ORDER — FENTANYL CITRATE (PF) 250 MCG/5ML IJ SOLN
INTRAMUSCULAR | Status: DC | PRN
  Administered 2023-07-05: 50 ug via INTRAVENOUS

## 2023-07-05 MED ORDER — PHENYLEPHRINE 80 MCG/ML (10ML) SYRINGE FOR IV PUSH (FOR BLOOD PRESSURE SUPPORT)
PREFILLED_SYRINGE | INTRAVENOUS | Status: AC
  Filled 2023-07-05: qty 10

## 2023-07-05 MED ORDER — MIDAZOLAM HCL 2 MG/2ML IJ SOLN
INTRAMUSCULAR | Status: DC | PRN
  Administered 2023-07-05: 2 mg via INTRAVENOUS

## 2023-07-05 MED ORDER — LACTATED RINGERS IV SOLN: INTRAVENOUS | Status: DC

## 2023-07-05 MED ORDER — SUGAMMADEX SODIUM 200 MG/2ML IV SOLN
INTRAVENOUS | Status: DC | PRN
  Administered 2023-07-05: 140 mg via INTRAVENOUS

## 2023-07-05 MED ORDER — ONDANSETRON HCL 4 MG/2ML IJ SOLN
INTRAMUSCULAR | Status: AC
  Filled 2023-07-05: qty 2

## 2023-07-05 MED ORDER — PROPOFOL 500 MG/50ML IV EMUL
INTRAVENOUS | Status: DC | PRN
  Administered 2023-07-05: 125 ug/kg/min via INTRAVENOUS

## 2023-07-05 MED ORDER — GLYCOPYRROLATE PF 0.2 MG/ML IJ SOSY
PREFILLED_SYRINGE | INTRAMUSCULAR | Status: AC
  Filled 2023-07-05: qty 1

## 2023-07-05 MED ORDER — SUCCINYLCHOLINE CHLORIDE 200 MG/10ML IV SOSY
PREFILLED_SYRINGE | INTRAVENOUS | Status: AC
  Filled 2023-07-05: qty 10

## 2023-07-05 SURGICAL SUPPLY — 32 items
BAG COUNTER SPONGE SURGICOUNT (BAG) ×2 IMPLANT
BALLN PULM 12 13.5 15X75 (BALLOONS) IMPLANT
BALLN PULMONARY 10-12 (MISCELLANEOUS) IMPLANT
BALLOON PULM 12 13.5 15X75 (BALLOONS) IMPLANT
BLADE SURG 15 STRL LF DISP TIS (BLADE) IMPLANT
BNDG EYE OVAL 2 1/8 X 2 5/8 (GAUZE/BANDAGES/DRESSINGS) IMPLANT
CANISTER SUCT 3000ML PPV (MISCELLANEOUS) ×2 IMPLANT
CATH DILATATION ATB (CATHETERS) IMPLANT
CNTNR URN SCR LID CUP LEK RST (MISCELLANEOUS) IMPLANT
COVER BACK TABLE 60X90IN (DRAPES) ×2 IMPLANT
COVER MAYO STAND STRL (DRAPES) ×2 IMPLANT
DRAPE HALF SHEET 40X57 (DRAPES) ×2 IMPLANT
GAUZE SPONGE 4X4 12PLY STRL (GAUZE/BANDAGES/DRESSINGS) ×2 IMPLANT
GLOVE BIO SURGEON STRL SZ7.5 (GLOVE) ×2 IMPLANT
GOWN STRL REUS W/ TWL LRG LVL3 (GOWN DISPOSABLE) IMPLANT
GUARD TEETH (MISCELLANEOUS) IMPLANT
KIT BASIN OR (CUSTOM PROCEDURE TRAY) ×2 IMPLANT
KIT TURNOVER KIT B (KITS) ×2 IMPLANT
NDL HYPO 25GX1X1/2 BEV (NEEDLE) IMPLANT
NEEDLE HYPO 25GX1X1/2 BEV (NEEDLE) IMPLANT
NS IRRIG 1000ML POUR BTL (IV SOLUTION) ×2 IMPLANT
PAD ARMBOARD POSITIONER FOAM (MISCELLANEOUS) ×4 IMPLANT
PATTIES SURGICAL .5 X1 (DISPOSABLE) ×2 IMPLANT
PATTIES SURGICAL .5 X3 (DISPOSABLE) ×2 IMPLANT
POSITIONER HEAD DONUT 9IN (MISCELLANEOUS) IMPLANT
SOL ANTI FOG 6CC (MISCELLANEOUS) IMPLANT
SURGILUBE 2OZ TUBE FLIPTOP (MISCELLANEOUS) IMPLANT
SUT SILK 2 0 PERMA HAND 18 BK (SUTURE) IMPLANT
SYR GAUGE ASSEMBLY ALLIANCE II (MISCELLANEOUS) IMPLANT
TOWEL GREEN STERILE FF (TOWEL DISPOSABLE) ×4 IMPLANT
TUBE CONNECTING 12X1/4 (SUCTIONS) ×2 IMPLANT
WATER STERILE IRR 1000ML POUR (IV SOLUTION) ×2 IMPLANT

## 2023-07-05 NOTE — H&P (Signed)
 Angela Duarte is an 63 y.o. female.   Chief Complaint: Subglottic stenosis HPI: 63 year old female with history of subglottic stenosis who has been treated with dilation, the last time being in March 2023.  She has had resumption of symptoms over the past six months with gradual worsening.  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    Urethritis    post coital    Past Surgical History:  Procedure Laterality Date   APPENDECTOMY  1976   BALLOON DILATION  12/21/2018   Procedure: Balloon Dilation;  Surgeon: Christia Reading, MD;  Location: [facility] OR;  Service: ENT;;   CO2 LASER APPLICATION  12/21/2018   Procedure: Co2 Laser Application;  Surgeon: Christia Reading, MD;  Location: [facility] OR;  Service: ENT;;   LARYNGOSCOPY N/A 12/21/2018   Procedure: Micro laryngoscopy;  Surgeon: Christia Reading, MD;  Location: [facility] OR;  Service: ENT;  Laterality: N/A;   MICROLARYNGOSCOPY WITH LASER AND BALLOON DILATION N/A 06/16/2021   Procedure: SUSPENDED DIRECT MICROLARYNGOSCOPY WITH CO2 LASER AND BALLOON DILATION;  Surgeon: Christia Reading, MD;  Location: [facility] OR;  Service: ENT;  Laterality: N/A;   MITOMYCIN C APPLICATION  12/21/2018   Procedure: Mitomycin C Application;  Surgeon: Christia Reading, MD;  Location: [facility] OR;  Service: ENT;;   MITOMYCIN C APPLICATION N/A 06/16/2021   Procedure: MITOMYCIN C APPLICATION;  Surgeon: Christia Reading, MD;  Location: [facility] OR;  Service: ENT;  Laterality: N/A;   TUBAL LIGATION Bilateral 1994   URETER SURGERY  1968   reconstruction   WISDOM TOOTH EXTRACTION  1981    Family History  Problem Relation Age of Onset   Breast cancer Sister 51       first occurence age 13, second at age 47   Pancreatic cancer Mother 34   Heart  attack Brother    Breast cancer Maternal Aunt        post menopausal   Breast cancer Paternal Aunt        post menoausal   Breast cancer Maternal Aunt        dx over 77   Lung cancer Maternal Aunt    Bladder Cancer Maternal Aunt    Breast cancer Cousin        mat first cousin   Breast cancer Cousin        2 additional mat first cousin   Lung cancer Paternal Uncle    Breast cancer Paternal Aunt        dx over 22   Breast cancer Cousin        pat cousin with possible breast cancer   Social History:  reports that she has never smoked. She has never used smokeless tobacco. She reports current alcohol use. She reports that she does not use drugs.  Allergies: No Known Allergies  Medications Prior to Admission  Medication Sig Dispense Refill   cetirizine (ZYRTEC) 10 MG tablet Take 10 mg by mouth daily.     Dextromethorphan-guaiFENesin (MUCINEX COUGH & CHEST CONGEST PO) Take 15 mLs by mouth daily as needed (congestion).     omeprazole (PRILOSEC) 20 MG capsule Take 20  mg by mouth daily.     PROAIR HFA 108 (90 Base) MCG/ACT inhaler Inhale 2 puffs into the lungs every 4 (four) hours as needed for wheezing or shortness of breath.     QVAR REDIHALER 80 MCG/ACT inhaler Inhale two doses twice daily to prevent cough or wheeze.  Rinse, gargle, and spit after use. (Patient taking differently: Inhale 2 puffs into the lungs 2 (two) times daily as needed (cough / wheeze).) 11 g 5   valACYclovir (VALTREX) 500 MG tablet Take 500 mg by mouth 2 (two) times daily as needed.      No results found for this or any previous visit (from the past 48 hours). No results found.  Review of Systems  All other systems reviewed and are negative.   Blood pressure 121/88, pulse 89, temperature 98 F (36.7 C), temperature source Oral, resp. rate 20, height 5\' 7"  (1.702 m), weight 73.9 kg, last menstrual period 05/05/2013, SpO2 99%. Physical Exam Constitutional:      Appearance: Normal appearance.  HENT:      Head: Normocephalic and atraumatic.     Right Ear: External ear normal.     Left Ear: External ear normal.     Nose: Nose normal.     Mouth/Throat:     Mouth: Mucous membranes are moist.     Pharynx: Oropharynx is clear.  Eyes:     Extraocular Movements: Extraocular movements intact.     Pupils: Pupils are equal, round, and reactive to light.  Cardiovascular:     Rate and Rhythm: Normal rate.  Pulmonary:     Effort: Pulmonary effort is normal.  Skin:    General: Skin is warm and dry.  Neurological:     General: No focal deficit present.     Mental Status: She is alert and oriented to person, place, and time.  Psychiatric:        Mood and Affect: Mood normal.        Behavior: Behavior normal.        Thought Content: Thought content normal.        Judgment: Judgment normal.      Assessment/Plan Subglottic stenosis  To OR for SMDL with CO2 laser dilation and mitomycin C application.  Christia Reading, MD 07/05/2023, 1:50 PM

## 2023-07-05 NOTE — Op Note (Signed)
 PREOPERATIVE DIAGNOSIS:  Subglottic stenosis   POSTOPERATIVE DIAGNOSIS:  Subglottic stenosis   PROCEDURE:  Suspended microdirect laryngoscopy with CO2 laser dilation   SURGEON:  Christia Reading, MD   ANESTHESIA:  General endotracheal anesthesia.   COMPLICATIONS:  None.   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  She has had gradual worsening of obstructive breathing in recent months.  She presents to the operating room for surgical management.   FINDINGS:  50% stenosis of the lower subglottic airway.   DESCRIPTION OF PROCEDURE:  The patient was identified in the holding room, informed consent having been obtained including discussion of risks, benefits and alternatives, the patient was brought to the operative suite and put the operative table in the supine position.  Anesthesia was induced and the patient was maintained via mask ventilation.  The eyes were taped closed and bed was turned 90 degrees from anesthesia.  The patient was given intravenous steroids during the case.  A tooth guard was placed over the upper teeth and a Stortz laryngoscope was used to expose the larynx.  On this occasion, none of the Stortz laryngoscopes could be brought into good exposure.  Instead, th Sataloff laryngoscope was inserted and able to expose the posterior larynx.  It was placed in suspension to Mayo stand using the Lewy arm.  Jet ventilation was attempted but did not align well.  Instead, a 5.5 endotracheal tube was placed through the laryngoscope and the patient was ventilated.  When the operating microscope was brought into place, the endotracheal tube was backed out and the area of stenosis was visualized.  Damp eye pads were taped over the eyes and a damp towel was placed over the face.  Photodocumentation was performed with the zero degree telescope.  The operating microscope was then brought into the field with CO2 laser  set on 4 watts.  It was then used to make radial cuts at 12 o'clock, 3 o'clock, and 9 o'clock.  The endotracheal tube was replaced and the patient was ventilated.  After a minute or so, the tube was backed out again.  The large tracheal balloon was inserted and inflated to 7 atmospheres for 30 seconds.  It was then brought out and the airway suctioned.  A postoperative photograph was made.  Mitomycin application was abandoned due to inability to jet ventilate.  Thus, the laryngoscope was taken out of suspension and removed from the patient's mouth while suctioning the airway while spraying the larynx topically with 2% lidocaine.  The laryngoscope was taken out of suspension and removed from the patient's mouth and the tooth guard was removed.  She was returned to mask ventilation, turned back to anesthesia for wakeup, and was moved to recovery room in stable condition.

## 2023-07-05 NOTE — Anesthesia Preprocedure Evaluation (Addendum)
 Anesthesia Evaluation  Patient identified by MRN, date of birth, ID band Patient awake    Reviewed: Allergy & Precautions, NPO status , Patient's Chart, lab work & pertinent test results  Airway Mallampati: I  TM Distance: >3 FB Neck ROM: Full    Dental no notable dental hx. (+) Teeth Intact, Dental Advisory Given   Pulmonary asthma    Pulmonary exam normal breath sounds clear to auscultation       Cardiovascular (-) hypertension(-) angina (-) Past MI Normal cardiovascular exam Rhythm:Regular Rate:Normal     Neuro/Psych    GI/Hepatic ,GERD  Medicated and Controlled,,  Endo/Other    Renal/GU      Musculoskeletal  (+) Arthritis ,    Abdominal   Peds  Hematology   Anesthesia Other Findings   Reproductive/Obstetrics                             Anesthesia Physical Anesthesia Plan  ASA: 2  Anesthesia Plan: General   Post-op Pain Management:    Induction:   PONV Risk Score and Plan: Treatment may vary due to age or medical condition, Midazolam, Ondansetron, Dexamethasone, Propofol infusion and TIVA  Airway Management Planned: Mask  Additional Equipment: None  Intra-op Plan:   Post-operative Plan: Extubation in OR  Informed Consent: I have reviewed the patients History and Physical, chart, labs and discussed the procedure including the risks, benefits and alternatives for the proposed anesthesia with the patient or authorized representative who has indicated his/her understanding and acceptance.     Dental advisory given  Plan Discussed with: CRNA and Surgeon  Anesthesia Plan Comments:        Anesthesia Quick Evaluation

## 2023-07-05 NOTE — Transfer of Care (Signed)
 Immediate Anesthesia Transfer of Care Note  Patient: Angela Duarte  Procedure(s) Performed: MICROLARYNGOSCOPY, WITH PROCEDURE USING LASER (Bilateral)  Patient Location: PACU  Anesthesia Type:General  Level of Consciousness: drowsy  Airway & Oxygen Therapy: Patient Spontanous Breathing and Patient connected to face mask oxygen  Post-op Assessment: Report given to RN and Post -op Vital signs reviewed and stable  Post vital signs: Reviewed and stable  Last Vitals:  Vitals Value Taken Time  BP 122/80 07/05/23 1515  Temp    Pulse 82 07/05/23 1515  Resp 13 07/05/23 1515  SpO2 94 % 07/05/23 1515  Vitals shown include unfiled device data.  Last Pain:  Vitals:   07/05/23 1128  TempSrc:   PainSc: 0-No pain         Complications: There were no known notable events for this encounter.

## 2023-07-05 NOTE — Anesthesia Postprocedure Evaluation (Signed)
 Anesthesia Post Note  Patient: Angela Duarte  Procedure(s) Performed: MICROLARYNGOSCOPY, WITH PROCEDURE USING LASER (Bilateral)     Patient location during evaluation: PACU Anesthesia Type: General Level of consciousness: awake and alert Pain management: pain level controlled Vital Signs Assessment: post-procedure vital signs reviewed and stable Respiratory status: spontaneous breathing, nonlabored ventilation, respiratory function stable and patient connected to nasal cannula oxygen Cardiovascular status: blood pressure returned to baseline and stable Postop Assessment: no apparent nausea or vomiting Anesthetic complications: no  There were no known notable events for this encounter.  Last Vitals:  Vitals:   07/05/23 1615 07/05/23 1630  BP: 107/72 116/76  Pulse: 68 72  Resp: 16 15  Temp:  36.6 C  SpO2: 94% 95%    Last Pain:  Vitals:   07/05/23 1630  TempSrc:   PainSc: 0-No pain   Pain Goal:                   Trevor Iha

## 2023-07-06 ENCOUNTER — Encounter (HOSPITAL_COMMUNITY): Payer: Self-pay | Admitting: Otolaryngology
